# Patient Record
Sex: Male | Born: 1950 | Race: White | Hispanic: No | Marital: Married | Smoking: Former smoker
Health system: Southern US, Community
[De-identification: ages and names within clinical notes are randomized; demographics above are authoritative.]

## PROBLEM LIST (undated history)

## (undated) DIAGNOSIS — G459 Transient cerebral ischemic attack, unspecified: Secondary | ICD-10-CM

## (undated) DIAGNOSIS — I219 Acute myocardial infarction, unspecified: Secondary | ICD-10-CM

## (undated) DIAGNOSIS — G20 Parkinson's disease: Secondary | ICD-10-CM

## (undated) DIAGNOSIS — Z87442 Personal history of urinary calculi: Secondary | ICD-10-CM

## (undated) DIAGNOSIS — I209 Angina pectoris, unspecified: Secondary | ICD-10-CM

## (undated) DIAGNOSIS — K219 Gastro-esophageal reflux disease without esophagitis: Secondary | ICD-10-CM

## (undated) DIAGNOSIS — F0390 Unspecified dementia without behavioral disturbance: Secondary | ICD-10-CM

## (undated) HISTORY — PX: COLONOSCOPY WITH ESOPHAGOGASTRODUODENOSCOPY (EGD): SHX5779

## (undated) HISTORY — PX: CHOLECYSTECTOMY: SHX55

## (undated) HISTORY — PX: OTHER SURGICAL HISTORY: SHX169

---

## 2005-04-28 ENCOUNTER — Ambulatory Visit: Payer: Self-pay | Admitting: Family Medicine

## 2005-05-09 ENCOUNTER — Other Ambulatory Visit: Payer: Self-pay

## 2005-05-11 ENCOUNTER — Ambulatory Visit: Payer: Self-pay | Admitting: Surgery

## 2005-05-22 ENCOUNTER — Ambulatory Visit: Payer: Self-pay | Admitting: Surgery

## 2008-04-07 ENCOUNTER — Ambulatory Visit: Payer: Self-pay | Admitting: Family Medicine

## 2008-05-01 ENCOUNTER — Ambulatory Visit: Payer: Self-pay | Admitting: Neurology

## 2008-05-27 ENCOUNTER — Ambulatory Visit: Payer: Self-pay | Admitting: Otolaryngology

## 2008-05-28 ENCOUNTER — Ambulatory Visit: Payer: Self-pay | Admitting: Neurology

## 2009-02-12 ENCOUNTER — Ambulatory Visit: Payer: Self-pay | Admitting: Neurology

## 2009-08-26 ENCOUNTER — Ambulatory Visit: Payer: Self-pay | Admitting: Neurology

## 2009-09-27 IMAGING — CT CT ANGIO HEAD-NECK
2 of 5 series · 10 of 33 positions shown · IV contrast (APPLIED)
Comparison: none

REASON FOR EXAM: acoustic neuroma   to include brain and brain stem
COMMENTS:

PROCEDURE:     CT  - CT ANGIOGRAPHY HEAD NECK W/WO  - May 28, 2008  [DATE]
RESULT:     Comparisons: No comparison
INDICATION: Acoustic neurinoma
TECHNIQUE: 85 ml of Isovue 370 were administered and the extracranial
carotid arteries bilaterally were scanned during the arterial phase from the
level of the superior portion of the frontal sinuses to the proximal neck.
These images were then transferred to the Siemens work station and were
subsequently reviewed utilizing 3-D reconstructions and MIP images.

[Series 4: soft tissue · axial · 0.51mm/px · z∈[-276,-66]mm · 5 of 106 slices shown]
[im 18/106  soft-tissue]
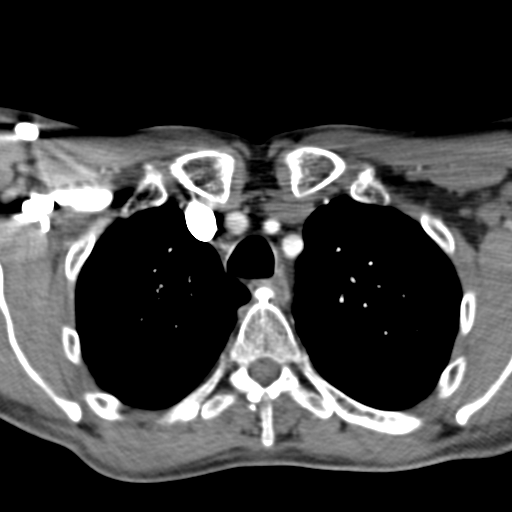
[im 36/106  bone]
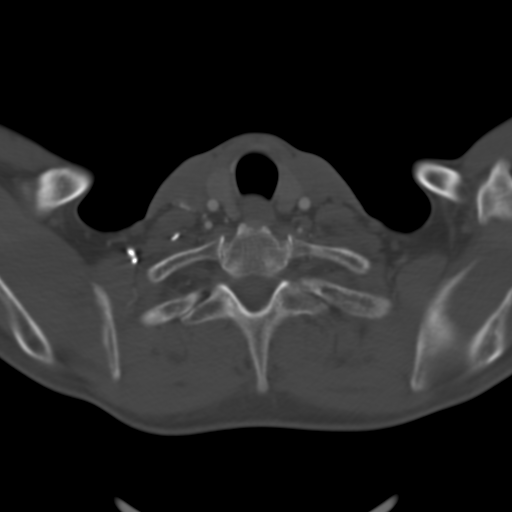
[im 53/106  soft-tissue]
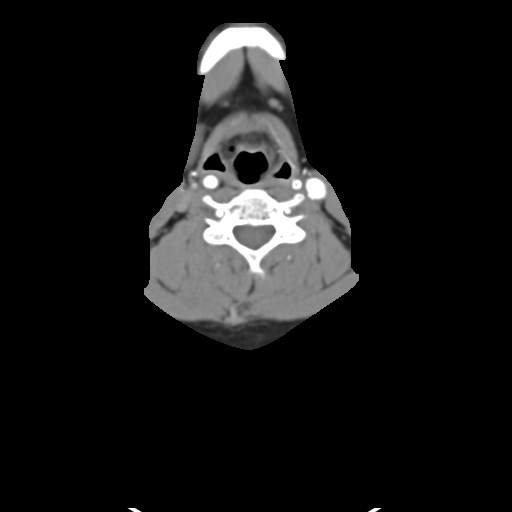
[im 71/106  bone]
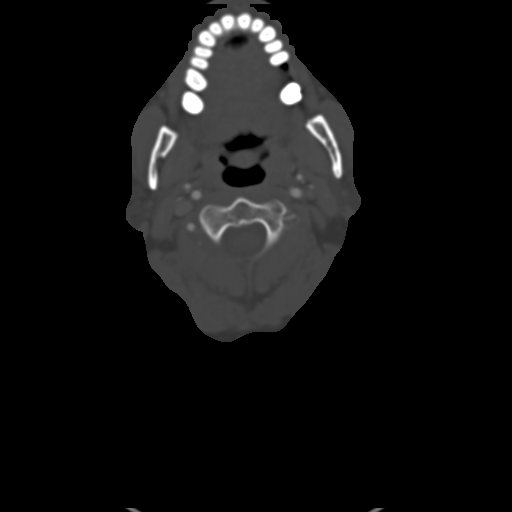
[im 88/106  soft-tissue]
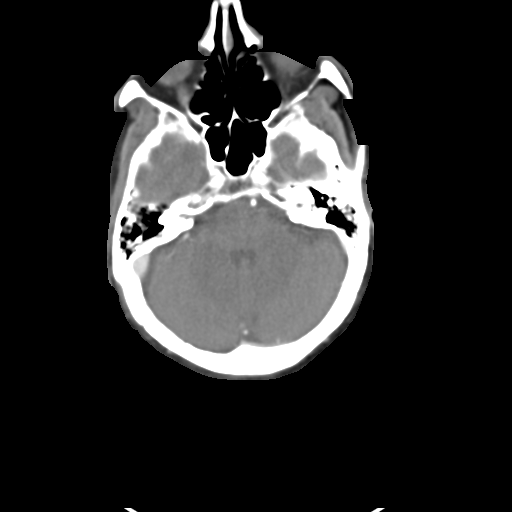

[Series 608: axial · axial · 0.38mm/px · z∈[-104,-36]mm · 5 of 103 slices shown]
[im 18/103  soft-tissue]
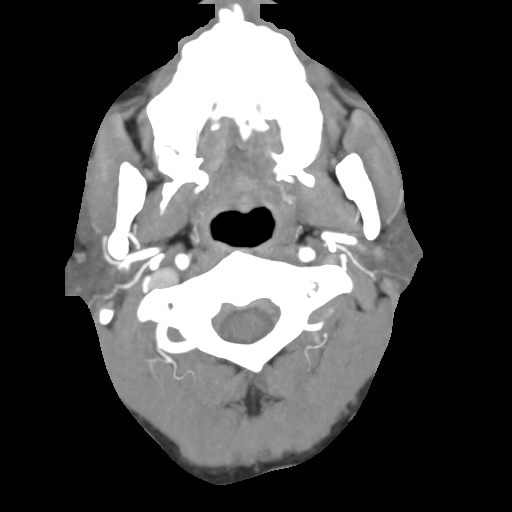
[im 35/103  soft-tissue]
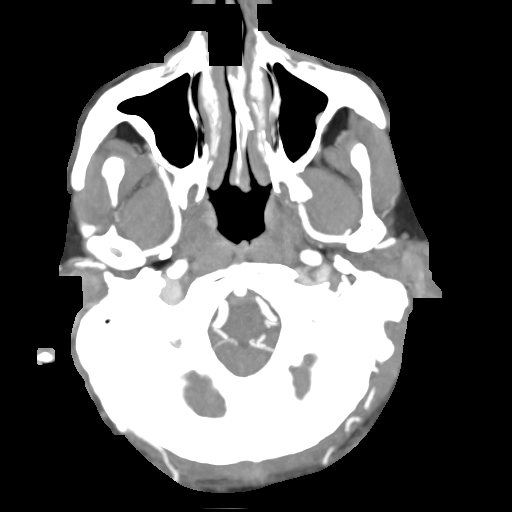
[im 52/103  soft-tissue]
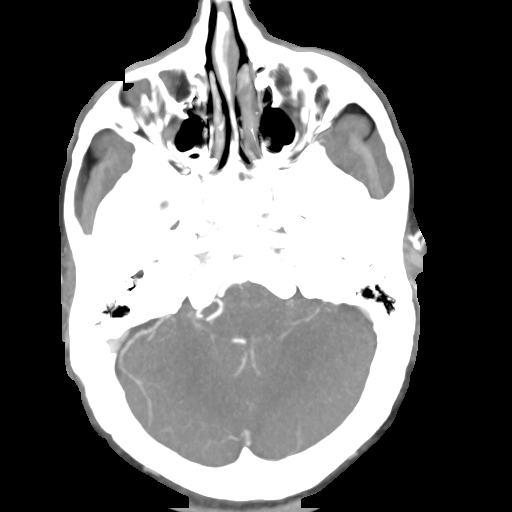
[im 69/103  soft-tissue]
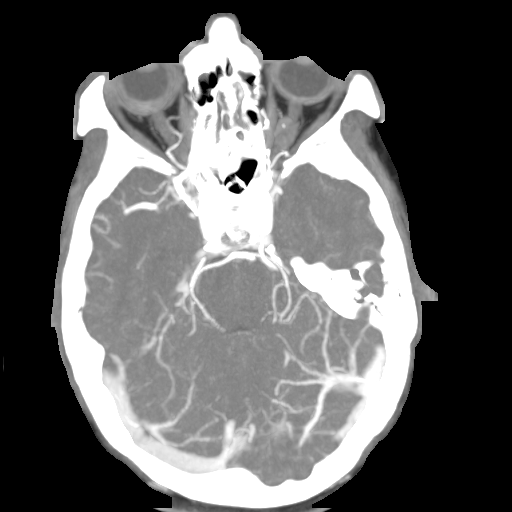
[im 86/103  soft-tissue]
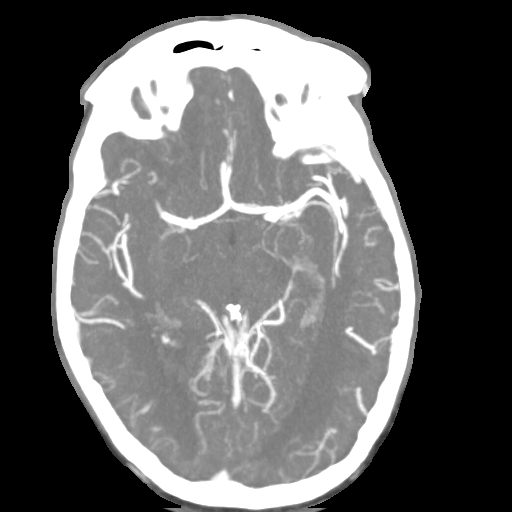

[10 of 33 positions shown; findings below may reference images not displayed]

FINDINGS: Extracranial circulation:

The left common carotid artery arises from the right brachiocephalic artery.
The vertebral arteries are identified arising from the subclavian arteries
and entering the transverse foramen bilaterally at C6. There is right
vertebral artery dominance. The distal aspect of the left vertebral artery
is small in caliber, but is patent.

The carotid arteries are identified and are symmetric and unremarkable. The
carotid bifurcations are also identified bilaterally and are unremarkable.
There is no evidence of aneurysm or carotid dissection bilaterally.

Intracranial circulation/circle-of-Willis:

Anterior: The internal carotid is identified and is patent bilaterally from
the skull base to the bifurcation into the middle cerebral and anterior
cerebral arteries. The anterior cerebral, anterior communicating, and
posterior communicating arteries are normal.

Posterior: The vertebral arteries are codominant and normal bilaterally from
the base of the skull to their confluence at the basilar artery. The
posterior cerebral arteries, superior cerebellar arteries, AICAs, and PICAs
are demonstrated and are unremarkable.

The left intracanalicular mass and surrounding recent MRI is not appreciated
on the current CT scan.
IMPRESSION: 1. No evidence of carotid artery stenosis or aneurysm.
2. Normal CTA of the brain.
3. The left intracanalicular mass demonstrated on the recent MRI is not
appreciated on the current CT scan. Please refer to MRI dated 05/27/2008
regarding the left acoustic neuroma.

## 2009-11-26 ENCOUNTER — Ambulatory Visit: Payer: Self-pay | Admitting: Neurology

## 2010-09-09 ENCOUNTER — Observation Stay: Payer: Self-pay | Admitting: Internal Medicine

## 2010-09-20 ENCOUNTER — Ambulatory Visit: Payer: Self-pay | Admitting: Gastroenterology

## 2010-09-21 ENCOUNTER — Ambulatory Visit: Payer: Self-pay | Admitting: Gastroenterology

## 2010-09-21 LAB — PATHOLOGY REPORT

## 2015-08-04 ENCOUNTER — Encounter: Payer: Self-pay | Admitting: Psychiatry

## 2015-08-04 ENCOUNTER — Emergency Department
Admission: EM | Admit: 2015-08-04 | Discharge: 2015-08-04 | Disposition: A | Discharge status: Other Acute Inpatient Hospital | Payer: Medicare Other | Attending: Emergency Medicine | Admitting: Emergency Medicine

## 2015-08-04 ENCOUNTER — Encounter: Payer: Self-pay | Admitting: Emergency Medicine

## 2015-08-04 ENCOUNTER — Inpatient Hospital Stay
Admission: EM | Admit: 2015-08-04 | Discharge: 2015-08-06 | DRG: 885 | Disposition: A | Discharge status: Home / Self Care | Payer: 59 | Source: Intra-hospital | Attending: Psychiatry | Admitting: Psychiatry

## 2015-08-04 DIAGNOSIS — R45851 Suicidal ideations: Secondary | ICD-10-CM | POA: Diagnosis present

## 2015-08-04 DIAGNOSIS — F329 Major depressive disorder, single episode, unspecified: Secondary | ICD-10-CM | POA: Insufficient documentation

## 2015-08-04 DIAGNOSIS — E538 Deficiency of other specified B group vitamins: Secondary | ICD-10-CM | POA: Diagnosis present

## 2015-08-04 DIAGNOSIS — G3183 Dementia with Lewy bodies: Secondary | ICD-10-CM | POA: Diagnosis present

## 2015-08-04 DIAGNOSIS — Z79899 Other long term (current) drug therapy: Secondary | ICD-10-CM

## 2015-08-04 DIAGNOSIS — I2510 Atherosclerotic heart disease of native coronary artery without angina pectoris: Secondary | ICD-10-CM | POA: Diagnosis present

## 2015-08-04 DIAGNOSIS — Z8673 Personal history of transient ischemic attack (TIA), and cerebral infarction without residual deficits: Secondary | ICD-10-CM

## 2015-08-04 DIAGNOSIS — F0280 Dementia in other diseases classified elsewhere without behavioral disturbance: Secondary | ICD-10-CM | POA: Diagnosis present

## 2015-08-04 DIAGNOSIS — Z9049 Acquired absence of other specified parts of digestive tract: Secondary | ICD-10-CM

## 2015-08-04 DIAGNOSIS — F332 Major depressive disorder, recurrent severe without psychotic features: Secondary | ICD-10-CM | POA: Diagnosis present

## 2015-08-04 DIAGNOSIS — Z87891 Personal history of nicotine dependence: Secondary | ICD-10-CM | POA: Diagnosis not present

## 2015-08-04 DIAGNOSIS — Z7902 Long term (current) use of antithrombotics/antiplatelets: Secondary | ICD-10-CM | POA: Diagnosis not present

## 2015-08-04 DIAGNOSIS — G20 Parkinson's disease: Secondary | ICD-10-CM | POA: Diagnosis present

## 2015-08-04 HISTORY — DX: Parkinson's disease: G20

## 2015-08-04 HISTORY — DX: Unspecified dementia, unspecified severity, without behavioral disturbance, psychotic disturbance, mood disturbance, and anxiety: F03.90

## 2015-08-04 HISTORY — DX: Transient cerebral ischemic attack, unspecified: G45.9

## 2015-08-04 LAB — URINALYSIS COMPLETE WITH MICROSCOPIC (ARMC ONLY)
Bacteria, UA: NONE SEEN
Bilirubin Urine: NEGATIVE
Glucose, UA: NEGATIVE mg/dL
HGB URINE DIPSTICK: NEGATIVE
KETONES UR: NEGATIVE mg/dL
LEUKOCYTES UA: NEGATIVE
NITRITE: NEGATIVE
PH: 7 (ref 5.0–8.0)
PROTEIN: NEGATIVE mg/dL
SPECIFIC GRAVITY, URINE: 1.004 — AB (ref 1.005–1.030)
Squamous Epithelial / LPF: NONE SEEN

## 2015-08-04 LAB — COMPREHENSIVE METABOLIC PANEL
ALBUMIN: 4.9 g/dL (ref 3.5–5.0)
ALT: 14 U/L — AB (ref 17–63)
ANION GAP: 11 (ref 5–15)
AST: 20 U/L (ref 15–41)
Alkaline Phosphatase: 64 U/L (ref 38–126)
BUN: 14 mg/dL (ref 6–20)
CHLORIDE: 101 mmol/L (ref 101–111)
CO2: 26 mmol/L (ref 22–32)
Calcium: 9.4 mg/dL (ref 8.9–10.3)
Creatinine, Ser: 0.91 mg/dL (ref 0.61–1.24)
GFR calc non Af Amer: >60 mL/min (ref >60)
GLUCOSE: 102 mg/dL — AB (ref 65–99)
Potassium: 4.1 mmol/L (ref 3.5–5.1)
SODIUM: 138 mmol/L (ref 135–145)
Total Bilirubin: 1.4 mg/dL — ABNORMAL HIGH (ref 0.3–1.2)
Total Protein: 7.8 g/dL (ref 6.5–8.1)

## 2015-08-04 LAB — URINE DRUG SCREEN, QUALITATIVE (ARMC ONLY)
AMPHETAMINES, UR SCREEN: NOT DETECTED
BENZODIAZEPINE, UR SCRN: NOT DETECTED
Barbiturates, Ur Screen: NOT DETECTED
Cannabinoid 50 Ng, Ur ~~LOC~~: NOT DETECTED
Cocaine Metabolite,Ur ~~LOC~~: NOT DETECTED
MDMA (Ecstasy)Ur Screen: NOT DETECTED
METHADONE SCREEN, URINE: NOT DETECTED
Opiate, Ur Screen: NOT DETECTED
Phencyclidine (PCP) Ur S: NOT DETECTED
Tricyclic, Ur Screen: NOT DETECTED

## 2015-08-04 LAB — CBC WITH DIFFERENTIAL/PLATELET
BASOS PCT: 1 %
Basophils Absolute: 0 10*3/uL (ref 0–0.1)
EOS ABS: 0.1 10*3/uL (ref 0–0.7)
EOS PCT: 1 %
HCT: 46.4 % (ref 40.0–52.0)
Hemoglobin: 15.2 g/dL (ref 13.0–18.0)
LYMPHS ABS: 1.2 10*3/uL (ref 1.0–3.6)
Lymphocytes Relative: 23 %
MCH: 29.6 pg (ref 26.0–34.0)
MCHC: 32.8 g/dL (ref 32.0–36.0)
MCV: 90.2 fL (ref 80.0–100.0)
Monocytes Absolute: 0.4 10*3/uL (ref 0.2–1.0)
Monocytes Relative: 8 %
NEUTROS PCT: 67 %
Neutro Abs: 3.4 10*3/uL (ref 1.4–6.5)
PLATELETS: 234 10*3/uL (ref 150–440)
RBC: 5.14 MIL/uL (ref 4.40–5.90)
RDW: 13.9 % (ref 11.5–14.5)
WBC: 5.1 10*3/uL (ref 3.8–10.6)

## 2015-08-04 LAB — SALICYLATE LEVEL

## 2015-08-04 LAB — ACETAMINOPHEN LEVEL: Acetaminophen (Tylenol), Serum: <10 ug/mL — ABNORMAL LOW (ref 10–30)

## 2015-08-04 LAB — ETHANOL

## 2015-08-04 MED ORDER — CLOPIDOGREL BISULFATE 75 MG PO TABS
75.0000 mg | ORAL_TABLET | Freq: Every day | ORAL | Status: DC
  Administered 2015-08-05 – 2015-08-06 (×2): 75 mg via ORAL
  Filled 2015-08-04 (×2): qty 1

## 2015-08-04 MED ORDER — MAGNESIUM HYDROXIDE 400 MG/5ML PO SUSP: 30.0000 mL | Freq: Every day | ORAL | Status: DC | PRN

## 2015-08-04 MED ORDER — MEMANTINE HCL 10 MG PO TABS
10.0000 mg | ORAL_TABLET | Freq: Two times a day (BID) | ORAL | Status: DC
  Administered 2015-08-04 – 2015-08-06 (×4): 10 mg via ORAL
  Filled 2015-08-04 (×6): qty 1

## 2015-08-04 MED ORDER — DONEPEZIL HCL 5 MG PO TABS: 10.0000 mg | ORAL_TABLET | Freq: Every day | ORAL | Status: DC

## 2015-08-04 MED ORDER — ESCITALOPRAM OXALATE 10 MG PO TABS: 20.0000 mg | ORAL_TABLET | Freq: Every day | ORAL | Status: DC

## 2015-08-04 MED ORDER — CYANOCOBALAMIN 1000 MCG/ML IJ SOLN: 1000.0000 ug | Freq: Once | INTRAMUSCULAR | Status: DC

## 2015-08-04 MED ORDER — DONEPEZIL HCL 5 MG PO TABS
10.0000 mg | ORAL_TABLET | Freq: Every day | ORAL | Status: DC
  Administered 2015-08-04 – 2015-08-05 (×2): 10 mg via ORAL
  Filled 2015-08-04 (×3): qty 1

## 2015-08-04 MED ORDER — CLONAZEPAM 0.5 MG PO TABS: 1.0000 mg | ORAL_TABLET | Freq: Every day | ORAL | Status: DC

## 2015-08-04 MED ORDER — CYANOCOBALAMIN 1000 MCG/ML IJ SOLN
1000.0000 ug | Freq: Once | INTRAMUSCULAR | Status: AC
  Administered 2015-08-04: 1000 ug via INTRAMUSCULAR
  Filled 2015-08-04: qty 1

## 2015-08-04 MED ORDER — QUETIAPINE FUMARATE 25 MG PO TABS: 25.0000 mg | ORAL_TABLET | Freq: Every day | ORAL | Status: DC

## 2015-08-04 MED ORDER — CLOPIDOGREL BISULFATE 75 MG PO TABS: 75.0000 mg | ORAL_TABLET | Freq: Every day | ORAL | Status: DC

## 2015-08-04 MED ORDER — MEMANTINE HCL 10 MG PO TABS: 10.0000 mg | ORAL_TABLET | Freq: Two times a day (BID) | ORAL | Status: DC

## 2015-08-04 MED ORDER — ACETAMINOPHEN 325 MG PO TABS
650.0000 mg | ORAL_TABLET | Freq: Four times a day (QID) | ORAL | Status: DC | PRN
Start: 2015-08-04 — End: 2015-08-06

## 2015-08-04 MED ORDER — CARBIDOPA-LEVODOPA ER 50-200 MG PO TBCR
1.0000 | EXTENDED_RELEASE_TABLET | Freq: Two times a day (BID) | ORAL | Status: DC
  Administered 2015-08-04 – 2015-08-06 (×4): 1 via ORAL
  Filled 2015-08-04 (×6): qty 1

## 2015-08-04 MED ORDER — ESCITALOPRAM OXALATE 10 MG PO TABS
20.0000 mg | ORAL_TABLET | Freq: Every day | ORAL | Status: DC
  Administered 2015-08-05 – 2015-08-06 (×2): 20 mg via ORAL
  Filled 2015-08-04 (×2): qty 2

## 2015-08-04 MED ORDER — QUETIAPINE FUMARATE 25 MG PO TABS
25.0000 mg | ORAL_TABLET | Freq: Every day | ORAL | Status: DC
  Administered 2015-08-05: 25 mg via ORAL
  Filled 2015-08-04: qty 1

## 2015-08-04 MED ORDER — CLONAZEPAM 1 MG PO TABS
1.0000 mg | ORAL_TABLET | Freq: Every day | ORAL | Status: DC
  Administered 2015-08-04 – 2015-08-05 (×2): 1 mg via ORAL
  Filled 2015-08-04 (×2): qty 1

## 2015-08-04 MED ORDER — CYANOCOBALAMIN 500 MCG PO TABS
1000.0000 ug | ORAL_TABLET | Freq: Every day | ORAL | Status: DC
  Administered 2015-08-05 – 2015-08-06 (×2): 1000 ug via ORAL
  Filled 2015-08-04 (×2): qty 2

## 2015-08-04 MED ORDER — ALUM & MAG HYDROXIDE-SIMETH 200-200-20 MG/5ML PO SUSP: 30.0000 mL | ORAL | Status: DC | PRN

## 2015-08-04 MED ORDER — CARBIDOPA-LEVODOPA ER 50-200 MG PO TBCR: 1.0000 | EXTENDED_RELEASE_TABLET | Freq: Two times a day (BID) | ORAL | Status: DC

## 2015-08-04 NOTE — ED Provider Notes (Signed)
[facility] Emergency Department Provider Note  ____________________________________________  Time seen: Approximately 12:10 PM  I have reviewed the triage vital signs and the nursing notes.   HISTORY  Chief Complaint Suicidal    HPI Darren Wang is a 65 y.o. male with a history of Parkinson's disease and TIA who is presenting today with worsening depression and suicidal ideation. He says that over the past several years his physical condition is worse and secondary to his neurologic disorders. He said that recently he has thought about killing himself with a gun. He does have access to guns at home. He has not attempted any self-harm prior to arrival. Denies any ingestions. No history of attempts ever in the past.   Past Medical History  Diagnosis Date  . Parkinson disease ([place])   . TIA (transient ischemic attack)   . Dementia     There are no active problems to display for this patient.   Past Surgical History  Procedure Laterality Date  . Cholecystectomy    . Leaky valve repair in bilateral le      No current outpatient prescriptions on file.  Allergies Review of patient's allergies indicates no known allergies.  No family history on file.  Social History Social History  Substance Use Topics  . Smoking status: Former Research scientist (life sciences)  . Smokeless tobacco: Not on file  . Alcohol Use: No    Review of Systems Constitutional: No fever/chills Eyes: No visual changes. ENT: No sore throat. Cardiovascular: Denies chest pain. Respiratory: Denies shortness of breath. Gastrointestinal: No abdominal pain.  No nausea, no vomiting.  No diarrhea.  No constipation. Genitourinary: Negative for dysuria. Musculoskeletal: Negative for back pain. Skin: Negative for rash. Neurological: Negative for headaches, focal weakness or numbness.  10-point ROS otherwise negative.  ____________________________________________   PHYSICAL EXAM:  VITAL SIGNS: ED  Triage Vitals  Enc Vitals Group     BP 08/04/15 1058 137/91 mmHg     Pulse Rate 08/04/15 1058 57     Resp 08/04/15 1058 18     Temp 08/04/15 1058 97.5 F (36.4 C)     Temp Source 08/04/15 1058 Oral     SpO2 08/04/15 1058 97 %     Weight 08/04/15 1058 165 lb (74.844 kg)     Height 08/04/15 1058 5\' 8"  (1.727 m)     Head Cir --      Peak Flow --      Pain Score 08/04/15 1111 6     Pain Loc --      Pain Edu? --      Excl. in [place]? --     Constitutional: Alert and oriented.  in no acute distress. Eyes: Conjunctivae are normal. PERRL. EOMI. Head: Atraumatic. Nose: No congestion/rhinnorhea. Mouth/Throat: Mucous membranes are moist.  Oropharynx non-erythematous. Neck: No stridor.   Cardiovascular: Normal rate, regular rhythm. Grossly normal heart sounds.  Good peripheral circulation. Respiratory: Normal respiratory effort.  No retractions. Lungs CTAB. Gastrointestinal: Soft and nontender. No distention. No abdominal bruits. No CVA tenderness. Musculoskeletal: No lower extremity tenderness nor edema.  No joint effusions. Neurologic:  Normal speech and language. No gross focal neurologic deficits are appreciated. No gait instability. Skin:  Skin is warm, dry and intact. No rash noted. Psychiatric: Depressed affect. Speech and behavior are normal.  ____________________________________________   LABS (all labs ordered are listed, but only abnormal results are displayed)  Labs Reviewed  CBC WITH DIFFERENTIAL/PLATELET  COMPREHENSIVE METABOLIC PANEL  ACETAMINOPHEN LEVEL  SALICYLATE LEVEL  ETHANOL  URINE DRUG SCREEN, QUALITATIVE ([facility] ONLY)  URINALYSIS COMPLETEWITH MICROSCOPIC ([facility] ONLY)   ____________________________________________  EKG   ____________________________________________  RADIOLOGY   ____________________________________________   PROCEDURES  ___________________________________________   INITIAL IMPRESSION / ASSESSMENT AND PLAN / ED COURSE  Pertinent  labs & imaging results that were available during my care of the patient were reviewed by me and considered in my medical decision making (see chart for details).  Family aware as well as the patient that he will be placed under involuntary commitment. ____________________________________________   FINAL CLINICAL IMPRESSION(S) / ED DIAGNOSES  Suicidal ideation.    Orbie Pyo, MD 08/04/15 [phone]

## 2015-08-04 NOTE — BH Assessment (Signed)
Assessment Note  Darren Wang is an 65 y.o. male Who presents to the ER via his wife Thayer Headings [phone]), after being advised to do so, by his neurologist. Patient was seen today by his neurologist, for his "3 month checkup." While in their office, he reported suicidal thoughts, with the plan of shooting his self. Patient owns a "shot gun" and his wife verified it.  Patient states, he's had these thoughts for the last 24 hours and they have increased over that length of time. He states, "I'm tired. I'm tired of living like this."  Patient was diagnosed with Parkinson disease, approximately 5 to 6 years ago. Since then his health has declined and he's been unable to do things, as he wishes. Per the report of his wife, the patient, spends most of his time, "home bound." Patient states, he is upset because he can't go for "walks and exercise," like he used to and things are not improving for him. He currently walks with a cane and falls approximately 3 times a week.   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 They were told by his doctors; he will eventually be completely blind. His quality of sleep has decrease over time, as well. Per the patient, for the last two weeks, he wakes up tired and feels as though he hasn't slept.  Patient denies HI and AV/H. He continues to report SI with plan to use his gun. Prior to today, patient denies any past psych history. Prior to being sick, he hasn't dealt with depression. He has no past suicidal ideations, gestures and attempts.  He's never seen a psychiatrist nor has he seen a therapist. Per the report of his wife, his neurologist prescribes his psychotropic medications and they believe they are no longer working for him.  Diagnosis: Depression  Past Medical History:  Past Medical History  Diagnosis Date  . Parkinson disease ([place])   . TIA (transient ischemic attack)   . Dementia     Past Surgical History   Procedure Laterality Date  . Cholecystectomy    . Leaky valve repair in bilateral le      Family History: No family history on file.  Social History:  reports that he has quit smoking. He does not have any smokeless tobacco history on file. He reports that he does not drink alcohol. His drug history is not on file.  Additional Social History:     CIWA: CIWA-Ar BP: (!) 137/91 mmHg Pulse Rate: (!) 57 COWS:    Allergies: Allergies no known allergies  Home Medications:  (Not in a hospital admission)  OB/GYN Status:  No LMP for male patient.  General Assessment Data Location of Assessment: [facility] ED TTS Assessment: In system Is this a Tele or Face-to-Face Assessment?: Face-to-Face Is this an Initial Assessment or a Re-assessment for this encounter?: Initial Assessment Marital status: Married [place] name: n/a Is patient pregnant?: No Pregnancy Status: No Living Arrangements: Spouse/significant other, Children Can pt return to current living arrangement?: Yes Admission Status: Involuntary Is patient capable of signing voluntary admission?: No Referral Source: MD Insurance type: [place] Screening Exam ([place]) Medical Exam completed: Yes  Crisis Care Plan Living Arrangements: Spouse/significant other, Children Legal Guardian: Other: (None) Name of Psychiatrist: None Name of Therapist: None  Education Status Is patient currently in school?: No Current Grade: None Highest grade of school patient has completed: [employer] Name of school: n/a Contact person: n/a  Risk to self with the past 6 months Suicidal Ideation: Yes-Currently Present Has patient been a risk to self within the past 6 months prior to admission? : Yes Suicidal Intent: Yes-Currently Present Has patient had any suicidal intent within the past 6 months prior to admission? : Yes Is patient at risk for suicide?: Yes Suicidal Plan?: Yes-Currently Present Has patient had any  suicidal plan within the past 6 months prior to admission? : Yes Specify Current Suicidal Plan: Shoot himself Access to Means: Yes Specify Access to Suicidal Means: Own a gun What has been your use of drugs/alcohol within the last 12 months?: None Reported Previous Attempts/Gestures: No How many times?: 0 Other Self Harm Risks: None Reported Triggers for Past Attempts: None known Intentional Self Injurious Behavior: None Family Suicide History: No Recent stressful life event(s): Recent negative physical changes Persecutory voices/beliefs?: No Depression: Yes Depression Symptoms: Feeling angry/irritable, Feeling worthless/self pity, Loss of interest in usual pleasures, Guilt, Fatigue, Isolating, Tearfulness Substance abuse history and/or treatment for substance abuse?: No Suicide prevention information given to non-admitted patients: Not applicable  Risk to Others within the past 6 months Homicidal Ideation: No Does patient have any lifetime risk of violence toward others beyond the six months prior to admission? : No Thoughts of Harm to Others: No Current Homicidal Intent: No Current Homicidal Plan: No Access to Homicidal Means: No Identified Victim: None Reported History of harm to others?: No Assessment of Violence: None Noted Violent Behavior Description: None Reported Does patient have access to weapons?: No Criminal Charges Pending?: No Does patient have a court date: No Is patient on probation?: No  Psychosis Hallucinations: None noted Delusions: None noted  Mental Status Report Appearance/Hygiene: Unremarkable, In scrubs, In hospital gown Eye Contact: Good Motor Activity: Unable to assess (Patient laying in the bed) Speech: Logical/coherent, Unremarkable Level of Consciousness: Alert Mood: Depressed, Sad, Pleasant Affect: Appropriate to circumstance, Depressed Anxiety Level: Minimal Thought Processes: Coherent, Relevant Judgement: Partial Orientation: Person,  Place, Time, Situation, Appropriate for developmental age Obsessive Compulsive Thoughts/Behaviors: Minimal  Cognitive Functioning Concentration: Normal Memory: Recent Intact, Remote Intact IQ: Average Insight: Poor Impulse Control: Poor Appetite: Fair Weight Loss: 0 Weight Gain: 0 Sleep: No Change (He is sleeping but not getting any rest) Total Hours of Sleep: 8 Vegetative Symptoms: None  ADLScreening [facility] Assessment Services) Patient's cognitive ability adequate to safely complete daily activities?: Yes Patient able to express need for assistance with ADLs?: Yes Independently performs ADLs?: Yes (appropriate for developmental age)  Prior Inpatient Therapy Prior Inpatient Therapy: No Prior Therapy Dates: None Reported Prior Therapy Facilty/Provider(s): None Reported Reason for Treatment: None Reported  Prior Outpatient Therapy Prior Outpatient Therapy: No Prior Therapy Dates: None Reported Prior Therapy Facilty/Provider(s): None Reported Reason for Treatment: None Reported Does patient have an ACCT team?: No Does patient have Intensive In-House Services?  : No Does patient have [facility] services? : No Does patient have P4CC services?: No  ADL Screening (condition at time of admission) Patient's cognitive ability adequate to safely complete daily activities?: Yes Is the patient deaf or have difficulty hearing?: No Does the patient have difficulty seeing, even when wearing glasses/contacts?: Yes (Trouble seeing out of left eye) Does the patient have difficulty concentrating, remembering, or making decisions?: No Patient able to express need for assistance with ADLs?: Yes Does the patient have difficulty dressing or bathing?: No Independently performs ADLs?: Yes (appropriate for developmental age) Does the patient have difficulty walking or climbing stairs?: No Weakness of Legs: Both Weakness of Arms/Hands:  Both  Home Assistive Devices/Equipment Home Assistive  Devices/Equipment: None  Therapy Consults (therapy consults require a physician order) PT Evaluation Needed: No OT Evalulation Needed: No SLP Evaluation Needed: No Abuse/Neglect Assessment (Assessment to be complete while patient is alone) Physical Abuse: Denies Verbal Abuse: Denies Sexual Abuse: Denies Exploitation of patient/patient's resources: Denies Self-Neglect: Denies Values / Beliefs Cultural Requests During Hospitalization: None Spiritual Requests During Hospitalization: None Consults Spiritual Care Consult Needed: No Social Work Consult Needed: No      Additional Information 1:1 In Past 12 Months?: No CIRT Risk: No Elopement Risk: No Does patient have medical clearance?: Yes  Child/Adolescent Assessment Running Away Risk: Denies (Patient is an adult  )  Disposition:  Disposition Initial Assessment Completed for this Encounter: Yes Disposition of Patient: Other dispositions (Patient to be seen by Psych MD) Other disposition(s): Other (Comment) (Patient to be seen by Psych MD)  On Site Evaluation by:   Reviewed with Physician:    Gunnar Fusi, MS, LCAS, [place], [place], CCSI 08/04/2015 2:11 PM

## 2015-08-04 NOTE — ED Notes (Signed)
Pt admits to feeling suicidal. Pt reports his plan would be to use a gun. Pt and pts wife reports that patient has access to  Guns. Pt's wife reports pt has been battling depression for a while.

## 2015-08-04 NOTE — Consult Note (Signed)
[facility] Face-to-Face Psychiatry Consult   Reason for Consult:  Consult for this 65 year old man who presents stating he is having suicidal ideation Referring Physician:  Edd Fabian Patient Identification: Darren Wang MRN:  [id number] Principal Diagnosis: Severe depression Diagnosis:   Patient Active Problem List   Diagnosis Date Noted  . Parkinson disease ([place]) [G20] 08/04/2015  . Severe depression [F32.9] 08/04/2015  . Suicidal ideation [R45.851] 08/04/2015    Total Time spent with patient: 1 hour  Subjective:   Darren Wang is a 65 y.o. male patient admitted with "I'm having suicidal thoughts".  HPI:  Patient interviewed. Chart reviewed. Labs and vitals reviewed. 65 year old man came to the emergency room stating he was having suicidal thoughts. He tells me that today and yesterday he was having thoughts about shooting himself with a shotgun. He told other interviewers the same thing. He says that his mood gets down and sad and lonely and it will come and go. It's been may be worse the last couple days. He is pretty vague in describing all this. He says that he gets lonely even though he lives with his wife and son. He feels sad at times. He denies any problems with sleep or appetite. He says he's been compliant with all of his medicines as far as he knows. The patient has Parkinson's disease and has also been told that he has Lewy body dementia and is being treated for both of these. He says he has talked about the depression with other providers as well. Patient denies homicidal ideation. He denies that he's had any visual or auditory hallucinations. He is not drinking or abusing any drugs.  Social history: Patient lives his wife and son. He is retired. Can't drive anymore. Mostly spends his time around the house.  Medical history: Parkinson's disease diagnosed wraps for about a year. Other than that seems to be in pretty good health no known history that he reports of heart disease or lung  disease. Says he is compliant with medicine. Sees a neurologist at [facility].  Substance abuse history: He says he doesn't drink or use any drugs and denies any past history of any substance abuse problems.  Past Psychiatric History: No prior psychiatric hospitalization. No history of suicide attempts. It appears that he is currently on Lexapro presumably prescribed by his neurologist. Patient is not aware of ever being on any other medicine. He does not report any manic or psychotic symptoms. He's not a very great historian and it's hard for them to give very detailed history about why his mood has been so bad.    Risk to Self: Suicidal Ideation: Yes-Currently Present Suicidal Intent: Yes-Currently Present Is patient at risk for suicide?: Yes Suicidal Plan?: Yes-Currently Present Specify Current Suicidal Plan: Shoot himself Access to Means: Yes Specify Access to Suicidal Means: Own a gun What has been your use of drugs/alcohol within the last 12 months?: None Reported How many times?: 0 Other Self Harm Risks: None Reported Triggers for Past Attempts: None known Intentional Self Injurious Behavior: None Risk to Others: Homicidal Ideation: No Thoughts of Harm to Others: No Current Homicidal Intent: No Current Homicidal Plan: No Access to Homicidal Means: No Identified Victim: None Reported History of harm to others?: No Assessment of Violence: None Noted Violent Behavior Description: None Reported Does patient have access to weapons?: No Criminal Charges Pending?: No Does patient have a court date: No Prior Inpatient Therapy: Prior Inpatient Therapy: No Prior Therapy Dates: None Reported Prior Therapy Facilty/Provider(s): None  Reported Reason for Treatment: None Reported Prior Outpatient Therapy: Prior Outpatient Therapy: No Prior Therapy Dates: None Reported Prior Therapy Facilty/Provider(s): None Reported Reason for Treatment: None Reported Does patient have an ACCT team?: No Does  patient have Intensive In-House Services?  : No Does patient have [facility] services? : No Does patient have P4CC services?: No  Past Medical History:  Past Medical History  Diagnosis Date  . Parkinson disease ([place])   . TIA (transient ischemic attack)   . Dementia     Past Surgical History  Procedure Laterality Date  . Cholecystectomy    . Leaky valve repair in bilateral le     Family History: No family history on file. Family Psychiatric  History: Patient denies knowing of any family history of mental health or substance abuse problems Social History:  History  Alcohol Use No     History  Drug Use Not on file    Social History   Social History  . Marital Status: Married    Spouse Name: N/A  . Number of Children: N/A  . Years of Education: N/A   Social History Main Topics  . Smoking status: Former Research scientist (life sciences)  . Smokeless tobacco: Not on file  . Alcohol Use: No  . Drug Use: Not on file  . Sexual Activity: Not on file   Other Topics Concern  . Not on file   Social History Narrative  . No narrative on file   Additional Social History:                          Allergies:  Allergies no known allergies  Labs:  Results for orders placed or performed during the hospital encounter of 08/04/15 (from the past 48 hour(s))  CBC with Differential     Status: None   Collection Time: 08/04/15 11:25 AM  Result Value Ref Range   WBC 5.1 3.8 - 10.6 K/uL   RBC 5.14 4.40 - 5.90 MIL/uL   Hemoglobin 15.2 13.0 - 18.0 g/dL   HCT 46.4 40.0 - 52.0 %   MCV 90.2 80.0 - 100.0 fL   MCH 29.6 26.0 - 34.0 pg   MCHC 32.8 32.0 - 36.0 g/dL   RDW 13.9 11.5 - 14.5 %   Platelets 234 150 - 440 K/uL   Neutrophils Relative % 67 %   Neutro Abs 3.4 1.4 - 6.5 K/uL   Lymphocytes Relative 23 %   Lymphs Abs 1.2 1.0 - 3.6 K/uL   Monocytes Relative 8 %   Monocytes Absolute 0.4 0.2 - 1.0 K/uL   Eosinophils Relative 1 %   Eosinophils Absolute 0.1 0 - 0.7 K/uL   Basophils Relative 1 %    Basophils Absolute 0.0 0 - 0.1 K/uL  Comprehensive metabolic panel     Status: Abnormal   Collection Time: 08/04/15 11:25 AM  Result Value Ref Range   Sodium 138 135 - 145 mmol/L   Potassium 4.1 3.5 - 5.1 mmol/L   Chloride 101 101 - 111 mmol/L   CO2 26 22 - 32 mmol/L   Glucose, Bld 102 (H) 65 - 99 mg/dL   BUN 14 6 - 20 mg/dL   Creatinine, Ser 0.91 0.61 - 1.24 mg/dL   Calcium 9.4 8.9 - 10.3 mg/dL   Total Protein 7.8 6.5 - 8.1 g/dL   Albumin 4.9 3.5 - 5.0 g/dL   AST 20 15 - 41 U/L   ALT 14 (L) 17 - 63 U/L  Alkaline Phosphatase 64 38 - 126 U/L   Total Bilirubin 1.4 (H) 0.3 - 1.2 mg/dL   GFR calc non Af Amer >60 >60 mL/min   GFR calc Af Amer >60 >60 mL/min    Comment: (NOTE) The eGFR has been calculated using the CKD EPI equation. This calculation has not been validated in all clinical situations. eGFR's persistently <60 mL/min signify possible Chronic Kidney Disease.    Anion gap 11 5 - 15  Acetaminophen level     Status: Abnormal   Collection Time: 08/04/15 11:25 AM  Result Value Ref Range   Acetaminophen (Tylenol), Serum <10 (L) 10 - 30 ug/mL    Comment:        THERAPEUTIC CONCENTRATIONS VARY SIGNIFICANTLY. A RANGE OF 10-30 ug/mL MAY BE AN EFFECTIVE CONCENTRATION FOR MANY PATIENTS. HOWEVER, SOME ARE BEST TREATED AT CONCENTRATIONS OUTSIDE THIS RANGE. ACETAMINOPHEN CONCENTRATIONS >150 ug/mL AT 4 HOURS AFTER INGESTION AND >50 ug/mL AT 12 HOURS AFTER INGESTION ARE OFTEN ASSOCIATED WITH TOXIC REACTIONS.   Salicylate level     Status: None   Collection Time: 08/04/15 11:25 AM  Result Value Ref Range   Salicylate Lvl <3.3 2.8 - 30.0 mg/dL  Ethanol     Status: None   Collection Time: 08/04/15 11:25 AM  Result Value Ref Range   Alcohol, Ethyl (B) <5 <5 mg/dL    Comment:        LOWEST DETECTABLE LIMIT FOR SERUM ALCOHOL IS 5 mg/dL FOR MEDICAL PURPOSES ONLY   Urine Drug Screen, Qualitative ([facility] only)     Status: None   Collection Time: 08/04/15  3:10 PM  Result Value  Ref Range   Tricyclic, Ur Screen NONE DETECTED NONE DETECTED   Amphetamines, Ur Screen NONE DETECTED NONE DETECTED   MDMA (Ecstasy)Ur Screen NONE DETECTED NONE DETECTED   Cocaine Metabolite,Ur [place] NONE DETECTED NONE DETECTED   Opiate, Ur Screen NONE DETECTED NONE DETECTED   Phencyclidine (PCP) Ur S NONE DETECTED NONE DETECTED   Cannabinoid 50 Ng, Ur [place] NONE DETECTED NONE DETECTED   Barbiturates, Ur Screen NONE DETECTED NONE DETECTED   Benzodiazepine, Ur Scrn NONE DETECTED NONE DETECTED   Methadone Scn, Ur NONE DETECTED NONE DETECTED    Comment: (NOTE) 354  Tricyclics, urine               Cutoff 1000 ng/mL 200  Amphetamines, urine             Cutoff 1000 ng/mL 300  MDMA (Ecstasy), urine           Cutoff 500 ng/mL 400  Cocaine Metabolite, urine       Cutoff 300 ng/mL 500  Opiate, urine                   Cutoff 300 ng/mL 600  Phencyclidine (PCP), urine      Cutoff 25 ng/mL 700  Cannabinoid, urine              Cutoff 50 ng/mL 800  Barbiturates, urine             Cutoff 200 ng/mL 900  Benzodiazepine, urine           Cutoff 200 ng/mL 1000 Methadone, urine                Cutoff 300 ng/mL 1100 1200 The urine drug screen provides only a preliminary, unconfirmed 1300 analytical test result and should not be used for non-medical 1400 purposes. Clinical consideration and professional judgment should 1500 be  applied to any positive drug screen result due to possible 1600 interfering substances. A more specific alternate chemical method 1700 must be used in order to obtain a confirmed analytical result.  1800 Gas chromato graphy / mass spectrometry (GC/MS) is the preferred 1900 confirmatory method.   Urinalysis complete, with microscopic ([facility] only)     Status: Abnormal   Collection Time: 08/04/15  3:10 PM  Result Value Ref Range   Color, Urine STRAW (A) YELLOW   APPearance CLEAR (A) CLEAR   Glucose, UA NEGATIVE NEGATIVE mg/dL   Bilirubin Urine NEGATIVE NEGATIVE   Ketones, ur NEGATIVE  NEGATIVE mg/dL   Specific Gravity, Urine 1.004 (L) 1.005 - 1.030   Hgb urine dipstick NEGATIVE NEGATIVE   pH 7.0 5.0 - 8.0   Protein, ur NEGATIVE NEGATIVE mg/dL   Nitrite NEGATIVE NEGATIVE   Leukocytes, UA NEGATIVE NEGATIVE   RBC / HPF 0-5 0 - 5 RBC/hpf   WBC, UA 0-5 0 - 5 WBC/hpf   Bacteria, UA NONE SEEN NONE SEEN   Squamous Epithelial / LPF NONE SEEN NONE SEEN    Current Facility-Administered Medications  Medication Dose Route Frequency Provider Last Rate Last Dose  . carbidopa-levodopa (SINEMET CR) 50-200 MG per tablet controlled release 1 tablet  1 tablet Oral BID Gonzella Lex, MD      . clonazePAM (KLONOPIN) tablet 1 mg  1 mg Oral QHS Biridiana Twardowski T Medora Roorda, MD      . clopidogrel (PLAVIX) tablet 75 mg  75 mg Oral Daily Tyniah Kastens T Zailen Albarran, MD      . cyanocobalamin ((VITAMIN B-12)) injection 1,000 mcg  1,000 mcg Intramuscular Once Gonzella Lex, MD      . donepezil (ARICEPT) tablet 10 mg  10 mg Oral QHS Philis Doke T Harlie Buening, MD      . escitalopram (LEXAPRO) tablet 20 mg  20 mg Oral Daily Gonzella Lex, MD      . memantine [facility]) tablet 10 mg  10 mg Oral BID Gonzella Lex, MD      . QUEtiapine (SEROQUEL) tablet 25 mg  25 mg Oral QPC supper Gonzella Lex, MD       Current Outpatient Prescriptions  Medication Sig Dispense Refill  . carbidopa-levodopa (SINEMET CR) 50-200 MG tablet Take 1 tablet by mouth 2 (two) times daily.    . clonazePAM (KLONOPIN) 1 MG tablet Take 1 mg by mouth at bedtime.    . clopidogrel (PLAVIX) 75 MG tablet Take 75 mg by mouth daily.    Marland Kitchen donepezil (ARICEPT) 10 MG tablet Take 10 mg by mouth daily before breakfast.    . escitalopram (LEXAPRO) 20 MG tablet Take 20 mg by mouth daily.    . memantine (NAMENDA) 10 MG tablet Take 10 mg by mouth 2 (two) times daily.    . Multiple Vitamin (MULTIVITAMIN) capsule Take 1 capsule by mouth daily.    . QUEtiapine (SEROQUEL) 25 MG tablet Take 25 mg by mouth daily. At 1700    . vitamin B-12 (CYANOCOBALAMIN) 1000 MCG tablet Take 1,000  mcg by mouth daily.      Musculoskeletal: Strength & Muscle Tone: within normal limits Gait & Station: unsteady Patient leans: N/A  Psychiatric Specialty Exam: Review of Systems  HENT: Negative.   Eyes: Negative.   Respiratory: Negative.   Cardiovascular: Negative.   Gastrointestinal: Negative.   Musculoskeletal: Negative.   Skin: Negative.   Neurological: Positive for tremors and weakness.  Psychiatric/Behavioral: Positive for depression, suicidal ideas and memory loss. Negative for  hallucinations. The patient is nervous/anxious. The patient does not have insomnia.     Blood pressure 137/91, pulse 57, temperature 97.5 F (36.4 C), temperature source Oral, resp. rate 18, height 5' 8"  (1.727 m), weight 74.844 kg (165 lb), SpO2 97 %.Body mass index is 25.09 kg/(m^2).  General Appearance: Disheveled  Eye Contact::  Good  Speech:  Slow  Volume:  Decreased  Mood:  Depressed and Dysphoric  Affect:  Depressed  Thought Process:  Circumstantial and Tangential  Orientation:  Full (Time, Place, and Person)  Thought Content:  Negative  Suicidal Thoughts:  Yes.  with intent/plan  Homicidal Thoughts:  No  Memory:  Immediate;   Good Recent;   Good Remote;   Fair  Judgement:  Fair  Insight:  Fair  Psychomotor Activity:  Decreased  Concentration:  Fair  Recall:  [employer] of Knowledge:Fair  Language: Fair  Akathisia:  No  Handed:  Right  AIMS (if indicated):     Assets:  Desire for Improvement Financial Resources/Insurance Housing Physical Health Resilience Social Support  ADL's:  Intact  Cognition: Impaired,  Mild  Sleep:      Treatment Plan Summary: Daily contact with patient to assess and evaluate symptoms and progress in treatment, Medication management and Plan 65 year old man who is reporting acute suicidal ideation. There does not seem to be a clear stressor that immediately precipitated this. He is reporting depressed mood symptoms that have been present probably  intermittently for months but it's not clear why he suddenly got this idea to shoot himself. Patient doesn't have a clear past mental health history although he does have Parkinson's disease. He tells me he's been told he had Lewy body dementia although to my examination his dementia is mild if present at all. He could remember 3 objects after 3 minutes and held a normal conversation. Completely oriented. Patient's affect is flat although part of that may be the Parkinson's disease. Given his statements about shooting himself and having suicidal thoughts I think it is imperative to admit him to the hospital. Patient is able to ambulate without assistance and should be safe on the unit. He will be admitted to the hospital and kept on his current medication including the Lexapro as well as the 2 medicines for dementia that he is taking. Treatment team downstairs can further assess. 15 minute checks in place. He is agreeable to the plan.  Disposition: Recommend psychiatric Inpatient admission when medically cleared. Supportive therapy provided about ongoing stressors.  Kevionna Heffler 08/04/2015 5:11 PM

## 2015-08-05 MED ORDER — BUSPIRONE HCL 5 MG PO TABS
5.0000 mg | ORAL_TABLET | Freq: Two times a day (BID) | ORAL | Status: DC
  Administered 2015-08-05 – 2015-08-06 (×3): 5 mg via ORAL
  Filled 2015-08-05 (×3): qty 1

## 2015-08-05 NOTE — Progress Notes (Signed)
Admission Note: Admitted for Depression and Suicidal thoughts with lethal intent to shoot self with a shot gun; patient has a shot gun. BIB and IVC by wife, Ms Hadi Francy [phone] after neurologist visit. Patient has Lewy Body Dementia and Parkinson disease, "I am tired. I am tired of living like this .... " Poor insight and judgment, lacks sufficient mental capacity to make rational decisions regarding financial and medical matters, recently a victim of online scams. No contrabands found on patient during initial admission search by 2 RNs, Gross Skin Intact except scaly, ashen left leg Shin, no open wounds, no tattoos or piercing.

## 2015-08-05 NOTE — BHH Group Notes (Signed)
[place] LCSW Group Therapy  08/05/2015 3:14 PM  Type of Therapy:  Group Therapy  Participation Level:  Active  Participation Quality:  Attentive  Affect:  Appropriate  Cognitive:  Alert  Insight:  Improving  Engagement in Therapy:  Improving  Modes of Intervention:  Discussion, Education, Socialization and Support  Summary of Progress/Problems: Balance in life: Patients will discuss the concept of balance and how it looks and feels to be unbalanced. Pt will identify areas in their life that is unbalanced and ways to become more balanced.  Darren Wang attended group and stayed the entire time. He discussed trust how it is important to him.   [place] MSW, [place]  08/05/2015, 3:14 PM

## 2015-08-05 NOTE — Progress Notes (Signed)
Recreation Therapy Notes  Date: 01.26.17 Time: 3:00 pm Location: Craft Room  Group Topic: Leisure Education  Goal Area(s) Addresses:  Patient will identify activities for each letter of the alphabet. Patient will verbalize ability to integrate positive leisure into life post d/c. 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  Behavioral Response: Attentive, Interactive  Intervention: Leisure Alphabet  Activity: Patients were given a Leisure Air traffic controller and instructed to list healthy leisure activities for each letter of the alphabet bet.  Education: LRT educated patients on what they need to participate in leisure.  Education Outcome: Acknowledges education/In group clarification offered   Clinical Observations/Feedback: Patient completed activity by writing healthy leisure activities. Patient contributed to group discussion by stating some of the leisure activities he wrote down.  Leonette [facility], LRT/CTRS 08/05/2015 4:22 PM

## 2015-08-05 NOTE — Tx Team (Signed)
Initial Interdisciplinary Treatment Plan   PATIENT STRESSORS: Financial difficulties Health problems   PATIENT STRENGTHS: Motivation for treatment/growth Supportive family/friends   PROBLEM LIST: Problem List/Patient Goals Date to be addressed Date deferred Reason deferred Estimated date of resolution  Depressive Symptoms 08/04/2015     Suicidal Thoughts 08/04/2015                                                DISCHARGE CRITERIA:  Improved stabilization in mood, thinking, and/or behavior Motivation to continue treatment in a less acute level of care Reduction of life-threatening or endangering symptoms to within safe limits  PRELIMINARY DISCHARGE PLAN: Attend aftercare/continuing care group Return to previous living arrangement  PATIENT/FAMIILY INVOLVEMENT: This treatment plan has been presented to and reviewed with the patient, NIV LUISI, and/or family member.  The patient and family have been given the opportunity to ask questions and make suggestions.  Alexiz Cothran T Montay Vanvoorhis 08/05/2015, 3:26 AM

## 2015-08-05 NOTE — Plan of Care (Signed)
Problem: Alteration in mood Goal: LTG-Patient reports reduction in suicidal thoughts (Patient reports reduction in suicidal thoughts and is able to verbalize a safety plan for whenever patient is feeling suicidal)  Outcome: Progressing Denies suicidal ideations.

## 2015-08-05 NOTE — BHH Group Notes (Signed)
[place] Group Notes:  (Nursing/MHT/Case Management/Adjunct)  Date:  08/05/2015  Time:  2:02 PM  Type of Therapy:  Psychoeducational Skills  Participation Level:  Active  Participation Quality:  Appropriate, Attentive, Sharing and Supportive  Affect:  Appropriate  Cognitive:  Appropriate  Insight:  Appropriate  Engagement in Group:  Supportive  Modes of Intervention:  Discussion and Education  Summary of Progress/Problems:  Celso Amy 08/05/2015, 2:02 PM

## 2015-08-05 NOTE — BHH Suicide Risk Assessment (Signed)
[facility] Admission Suicide Risk Assessment   Nursing information obtained from:    Demographic factors:    Current Mental Status:    Loss Factors:    Historical Factors:    Risk Reduction Factors:     Total Time spent with patient: 1 hour Principal Problem: Major depressive disorder, recurrent severe without psychotic features ([place]) Diagnosis:   Patient Active Problem List   Diagnosis Date Noted  . Parkinson disease ([place]) [G20] 08/04/2015  . Suicidal ideation [R45.851] 08/04/2015  . Major depressive disorder, recurrent severe without psychotic features ([place]) [F33.2] 08/04/2015  . Lewy body dementia [G31.83, F02.80] 08/04/2015   Subjective Data: Depression and suicidal ideation  Continued Clinical Symptoms:  Alcohol Use Disorder Identification Test Final Score (AUDIT): 0 The "Alcohol Use Disorders Identification Test", Guidelines for Use in Primary Care, Second Edition.  World Pharmacologist [facility]). Score between 0-7:  no or low risk or alcohol related problems. Score between 8-15:  moderate risk of alcohol related problems. Score between 16-19:  high risk of alcohol related problems. Score 20 or above:  warrants further diagnostic evaluation for alcohol dependence and treatment.   CLINICAL FACTORS:   Depression:   Hopelessness Severe   Musculoskeletal: Strength & Muscle Tone: within normal limits Gait & Station: normal Patient leans: N/A  Psychiatric Specialty Exam: Review of Systems  Neurological: Positive for tremors.  All other systems reviewed and are negative.   Blood pressure 111/73, pulse 49, temperature 97.7 F (36.5 C), temperature source Oral, resp. rate 18, height 5\' 8"  (1.727 m), weight 74.844 kg (165 lb), SpO2 99 %.Body mass index is 25.09 kg/(m^2).  General Appearance: Casual  Eye Contact::  Good  Speech:  Clear and Coherent  Volume:  Normal  Mood:  Depressed  Affect:  Blunt  Thought Process:  Goal Directed  Orientation:  Full (Time, Place, and  Person)  Thought Content:  WDL  Suicidal Thoughts:  Yes.  with intent/plan  Homicidal Thoughts:  No  Memory:  Immediate;   Fair Recent;   Fair Remote;   Fair  Judgement:  Fair  Insight:  Fair  Psychomotor Activity:  Decreased  Concentration:  Fair  Recall:  [employer] of Knowledge:Fair  Language: Fair  Akathisia:  No  Handed:  Right  AIMS (if indicated):      Assets:  Communication Skills Desire for Improvement Financial Resources/Insurance Housing Intimacy Physical Health Resilience Social Support  Sleep:  Number of Hours: 6.3  Cognition: WNL  ADL's:  Intact    COGNITIVE FEATURES THAT CONTRIBUTE TO RISK:  None    SUICIDE RISK:   Mild:  Suicidal ideation of limited frequency, intensity, duration, and specificity.  There are no identifiable plans, no associated intent, mild dysphoria and related symptoms, good self-control (both objective and subjective assessment), few other risk factors, and identifiable protective factors, including available and accessible social support.  PLAN OF CARE:Hospital admission, medication management, discharge planning.   Mr. Eilertson is a 65 year old male with a history of depression and Parkinson's dementia admitted for worsening of depression and suicidal ideation.  1. Suicidal ideation. The patient is able to contract for safety in the hospital.  2. Mood. He has been maintained on Lexapro and Seroquel. We will add BuSpar for augmentation.  3. Parkinson's disease. He is on Sinemet.  4. Lewy body dementia. He is on Aricept and Namenda.  5. Vitamin B12 deficiency we'll continue vitamin B12.  6. CAD. He is on Plavix.  7. Disposition. He will be discharged to home  with his family. He will follow up with  CBC.  I certify that inpatient services furnished can reasonably be expected to improve the patient's condition.   Orson Slick, MD 08/05/2015, 12:53 PM

## 2015-08-05 NOTE — Progress Notes (Signed)
D: Noted guarded . Visited by parents this am shift .  Patient stated slept fair last night .Stated appetite is good and energy level  Is normal. Stated concentration is good . Stated on Depression scale , hopeless and anxiety .( low 0-10 high) Denies suicidal  homicidal ideations  .  No auditory hallucinations  No pain concerns . Appropriate ADL'S. Interacting with peers and staff.  A: Encourage patient participation with unit programming . Instruction  Given on  Medication , verbalize understanding. R: Voice no other concerns. Staff continue to monitor

## 2015-08-05 NOTE — Progress Notes (Signed)
   08/05/15 1440  Clinical Encounter Type  Visited With Patient  Visit Type Behavioral Health;Initial;Spiritual support  Referral From Nurse  Consult/Referral To Chaplain  Spiritual Encounters  Spiritual Needs Emotional  Stress Factors  Patient Stress Factors Family relationships;Major life changes  Visited w/patient for life stress assessment. Pt. presented himself lucid & stable. Denied current desires for self harm. Demonstrated range of emotions from serious to humor. Chap. Nery Frappier G. [place]

## 2015-08-05 NOTE — BHH Suicide Risk Assessment (Signed)
[place] INPATIENT:  Family/Significant Other Suicide Prevention Education  Suicide Prevention Education:  Education Completed; Demetrio Gressman (wife) [phone] has been identified by the patient as the family member/significant other with whom the patient will be residing, and identified as the person(s) who will aid the patient in the event of a mental health crisis (suicidal ideations/suicide attempt).  With written consent from the patient, the family member/significant other has been provided the following suicide prevention education, prior to the and/or following the discharge of the patient.  The suicide prevention education provided includes the following:  Suicide risk factors  Suicide prevention and interventions  National Suicide Hotline telephone number  [facility] assessment telephone number  [facility] Emergency Assistance [place] and/or Residential Mobile Crisis Unit telephone number  Request made of family/significant other to:  Remove weapons (e.g., guns, rifles, knives), all items previously/currently identified as safety concern.    Remove drugs/medications (over-the-counter, prescriptions, illicit drugs), all items previously/currently identified as a safety concern.  The family member/significant other verbalizes understanding of the suicide prevention education information provided.  The family member/significant other agrees to remove the items of safety concern listed above.  Keene Breath, MSW, Latanya Presser [phone] 08/05/2015, 4:05 PM

## 2015-08-05 NOTE — H&P (Signed)
Psychiatric Admission Assessment Adult  Patient Identification: AKILI CORSETTI MRN:  [id number] Date of Evaluation:  08/05/2015 Chief Complaint:  Depression Principal Diagnosis: Major depressive disorder, recurrent severe without psychotic features ([place]) Diagnosis:   Patient Active Problem List   Diagnosis Date Noted  . Parkinson disease ([place]) [G20] 08/04/2015  . Suicidal ideation [R45.851] 08/04/2015  . Major depressive disorder, recurrent severe without psychotic features ([place]) [F33.2] 08/04/2015  . Lewy body dementia [G31.83, F02.80] 08/04/2015   History of Present Illness:  Identifying data.. Mr. Paternoster is a 65 year old male with a history of depression, Parkinson's disease, and cognitive decline.  Chief complaint. "I wanted to talk to someone."  History of present illness. Information was obtained from the patient and the chart.  Mr. Hohmann did not have any problems with mental illness until 8 years ago when he was diagnosed with Parkinson's disease and Lewy body dementia. He became increasingly depressed and was started on Lexapro by his primary provider. In recent weeks he notices worsening of depression with decreased appetite, poor sleep, anhedonia, feeling of guilt and hopelessness worthlessness, poor energy and concentration, social isolation and crying status. He started having passing thoughts of suicide. He feels that everybody would be better off without him. He feels that his family doesn't love him. He feels that his wife never speaks to him. He was thinking about ending it all. He shares his worries with the wife however and at her insistence he came to the emergency room for help. The patient denies symptoms of psychosis or symptoms suggestive of bipolar mania. He does not use alcohol or drugs. His anxiety is slightly higher in the past few days but he denies symptoms of OCD, PTSD or panic attacks.  Past psychiatric history. He has never been hospitalized. There were no suicide  attempts. Lexapro is the only medication he has ever tried. Reportedly he was diagnosed with Lewy body dementia first and Parkinson's disease next. He does report that he has not been driving for the past 8 years because he gets anxious and lost. He experiences some mild cognitive problems but his memory has not been worsening in both this way in the past 8 years. I "questioned diagnosis of dementia.  Family psychiatric history. Nonreported.  Social history. 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 He is extremely worried about his cognitive decline. He lives with his wife and 28 year old son. He has other children and grandchildren.  Total Time spent with patient: 1 hour  Past Psychiatric History:  depression Parkinson's disease.  Risk to Self: Is patient at risk for suicide?: Yes Risk to Others:   Prior Inpatient Therapy:   Prior Outpatient Therapy:    Alcohol Screening: Patient refused Alcohol Screening Tool: Yes 1. How often do you have a drink containing alcohol?: Never 2. How many drinks containing alcohol do you have on a typical day when you are drinking?: 1 or 2 3. How often do you have six or more drinks on one occasion?: Never Preliminary Score: 0 4. How often during the last year have you found that you were not able to stop drinking once you had started?: Never 5. How often during the last year have you failed to do what was normally expected from you becasue of drinking?: Never 6. How often during the last year have you needed a first drink in the morning to get yourself going after a heavy drinking session?: Never 7. How often during the last year have you had a  feeling of guilt of remorse after drinking?: Never 8. How often during the last year have you been unable to remember what happened the night before because you had been drinking?: Never 9. Have you or someone else been injured as a result of your drinking?: No 10. Has a relative or friend or a doctor or  another health worker been concerned about your drinking or suggested you cut down?: No Alcohol Use Disorder Identification Test Final Score (AUDIT): 0 Brief Intervention: Yes Substance Abuse History in the last 12 months:  No. Consequences of Substance Abuse: NA Previous Psychotropic Medications: Yes  Psychological Evaluations: No  Past Medical History:  Past Medical History  Diagnosis Date  . Parkinson disease ([place])   . TIA (transient ischemic attack)   . Dementia     Past Surgical History  Procedure Laterality Date  . Cholecystectomy    . Leaky valve repair in bilateral le     Family History: History reviewed. No pertinent family history. Family Psychiatric  History:  none reported. Social History:  History  Alcohol Use No     History  Drug Use Not on file    Social History   Social History  . Marital Status: Married    Spouse Name: N/A  . Number of Children: N/A  . Years of Education: N/A   Social History Main Topics  . Smoking status: Former Research scientist (life sciences)  . Smokeless tobacco: None  . Alcohol Use: No  . Drug Use: None  . Sexual Activity: Not Asked   Other Topics Concern  . None   Social History Narrative   Additional Social History:                         Allergies:  No Known Allergies Lab Results:  Results for orders placed or performed during the hospital encounter of 08/04/15 (from the past 48 hour(s))  CBC with Differential     Status: None   Collection Time: 08/04/15 11:25 AM  Result Value Ref Range   WBC 5.1 3.8 - 10.6 K/uL   RBC 5.14 4.40 - 5.90 MIL/uL   Hemoglobin 15.2 13.0 - 18.0 g/dL   HCT 46.4 40.0 - 52.0 %   MCV 90.2 80.0 - 100.0 fL   MCH 29.6 26.0 - 34.0 pg   MCHC 32.8 32.0 - 36.0 g/dL   RDW 13.9 11.5 - 14.5 %   Platelets 234 150 - 440 K/uL   Neutrophils Relative % 67 %   Neutro Abs 3.4 1.4 - 6.5 K/uL   Lymphocytes Relative 23 %   Lymphs Abs 1.2 1.0 - 3.6 K/uL   Monocytes Relative 8 %   Monocytes Absolute 0.4 0.2 - 1.0 K/uL    Eosinophils Relative 1 %   Eosinophils Absolute 0.1 0 - 0.7 K/uL   Basophils Relative 1 %   Basophils Absolute 0.0 0 - 0.1 K/uL  Comprehensive metabolic panel     Status: Abnormal   Collection Time: 08/04/15 11:25 AM  Result Value Ref Range   Sodium 138 135 - 145 mmol/L   Potassium 4.1 3.5 - 5.1 mmol/L   Chloride 101 101 - 111 mmol/L   CO2 26 22 - 32 mmol/L   Glucose, Bld 102 (H) 65 - 99 mg/dL   BUN 14 6 - 20 mg/dL   Creatinine, Ser 0.91 0.61 - 1.24 mg/dL   Calcium 9.4 8.9 - 10.3 mg/dL   Total Protein 7.8 6.5 - 8.1 g/dL   Albumin  4.9 3.5 - 5.0 g/dL   AST 20 15 - 41 U/L   ALT 14 (L) 17 - 63 U/L   Alkaline Phosphatase 64 38 - 126 U/L   Total Bilirubin 1.4 (H) 0.3 - 1.2 mg/dL   GFR calc non Af Amer >60 >60 mL/min   GFR calc Af Amer >60 >60 mL/min    Comment: (NOTE) The eGFR has been calculated using the CKD EPI equation. This calculation has not been validated in all clinical situations. eGFR's persistently <60 mL/min signify possible Chronic Kidney Disease.    Anion gap 11 5 - 15  Acetaminophen level     Status: Abnormal   Collection Time: 08/04/15 11:25 AM  Result Value Ref Range   Acetaminophen (Tylenol), Serum <10 (L) 10 - 30 ug/mL    Comment:        THERAPEUTIC CONCENTRATIONS VARY SIGNIFICANTLY. A RANGE OF 10-30 ug/mL MAY BE AN EFFECTIVE CONCENTRATION FOR MANY PATIENTS. HOWEVER, SOME ARE BEST TREATED AT CONCENTRATIONS OUTSIDE THIS RANGE. ACETAMINOPHEN CONCENTRATIONS >150 ug/mL AT 4 HOURS AFTER INGESTION AND >50 ug/mL AT 12 HOURS AFTER INGESTION ARE OFTEN ASSOCIATED WITH TOXIC REACTIONS.   Salicylate level     Status: None   Collection Time: 08/04/15 11:25 AM  Result Value Ref Range   Salicylate Lvl <2.8 2.8 - 30.0 mg/dL  Ethanol     Status: None   Collection Time: 08/04/15 11:25 AM  Result Value Ref Range   Alcohol, Ethyl (B) <5 <5 mg/dL    Comment:        LOWEST DETECTABLE LIMIT FOR SERUM ALCOHOL IS 5 mg/dL FOR MEDICAL PURPOSES ONLY   Urine Drug  Screen, Qualitative ([facility] only)     Status: None   Collection Time: 08/04/15  3:10 PM  Result Value Ref Range   Tricyclic, Ur Screen NONE DETECTED NONE DETECTED   Amphetamines, Ur Screen NONE DETECTED NONE DETECTED   MDMA (Ecstasy)Ur Screen NONE DETECTED NONE DETECTED   Cocaine Metabolite,Ur [place] NONE DETECTED NONE DETECTED   Opiate, Ur Screen NONE DETECTED NONE DETECTED   Phencyclidine (PCP) Ur S NONE DETECTED NONE DETECTED   Cannabinoid 50 Ng, Ur [place] NONE DETECTED NONE DETECTED   Barbiturates, Ur Screen NONE DETECTED NONE DETECTED   Benzodiazepine, Ur Scrn NONE DETECTED NONE DETECTED   Methadone Scn, Ur NONE DETECTED NONE DETECTED    Comment: (NOTE) 315  Tricyclics, urine               Cutoff 1000 ng/mL 200  Amphetamines, urine             Cutoff 1000 ng/mL 300  MDMA (Ecstasy), urine           Cutoff 500 ng/mL 400  Cocaine Metabolite, urine       Cutoff 300 ng/mL 500  Opiate, urine                   Cutoff 300 ng/mL 600  Phencyclidine (PCP), urine      Cutoff 25 ng/mL 700  Cannabinoid, urine              Cutoff 50 ng/mL 800  Barbiturates, urine             Cutoff 200 ng/mL 900  Benzodiazepine, urine           Cutoff 200 ng/mL 1000 Methadone, urine                Cutoff 300 ng/mL 1100 1200 The urine drug screen provides only  a preliminary, unconfirmed 1300 analytical test result and should not be used for non-medical 1400 purposes. Clinical consideration and professional judgment should 1500 be applied to any positive drug screen result due to possible 1600 interfering substances. A more specific alternate chemical method 1700 must be used in order to obtain a confirmed analytical result.  1800 Gas chromato graphy / mass spectrometry (GC/MS) is the preferred 1900 confirmatory method.   Urinalysis complete, with microscopic ([facility] only)     Status: Abnormal   Collection Time: 08/04/15  3:10 PM  Result Value Ref Range   Color, Urine STRAW (A) YELLOW   APPearance CLEAR (A) CLEAR    Glucose, UA NEGATIVE NEGATIVE mg/dL   Bilirubin Urine NEGATIVE NEGATIVE   Ketones, ur NEGATIVE NEGATIVE mg/dL   Specific Gravity, Urine 1.004 (L) 1.005 - 1.030   Hgb urine dipstick NEGATIVE NEGATIVE   pH 7.0 5.0 - 8.0   Protein, ur NEGATIVE NEGATIVE mg/dL   Nitrite NEGATIVE NEGATIVE   Leukocytes, UA NEGATIVE NEGATIVE   RBC / HPF 0-5 0 - 5 RBC/hpf   WBC, UA 0-5 0 - 5 WBC/hpf   Bacteria, UA NONE SEEN NONE SEEN   Squamous Epithelial / LPF NONE SEEN NONE SEEN    Metabolic Disorder Labs:  No results found for: HGBA1C, MPG No results found for: PROLACTIN No results found for: CHOL, TRIG, HDL, CHOLHDL, VLDL, LDLCALC  Current Medications: Current Facility-Administered Medications  Medication Dose Route Frequency Provider Last Rate Last Dose  . acetaminophen (TYLENOL) tablet 650 mg  650 mg Oral Q6H PRN Gonzella Lex, MD      . alum & mag hydroxide-simeth (MAALOX/MYLANTA) 200-200-20 MG/5ML suspension 30 mL  30 mL Oral Q4H PRN Gonzella Lex, MD      . busPIRone (BUSPAR) tablet 5 mg  5 mg Oral BID Clovis Fredrickson, MD   5 mg at 08/05/15 1232  . carbidopa-levodopa (SINEMET CR) 50-200 MG per tablet controlled release 1 tablet  1 tablet Oral BID Gonzella Lex, MD   1 tablet at 08/05/15 0829  . clonazePAM (KLONOPIN) tablet 1 mg  1 mg Oral QHS Gonzella Lex, MD   1 mg at 08/04/15 2155  . clopidogrel (PLAVIX) tablet 75 mg  75 mg Oral Daily Gonzella Lex, MD   75 mg at 08/05/15 0828  . cyanocobalamin tablet 1,000 mcg  1,000 mcg Oral Daily Clovis Fredrickson, MD   1,000 mcg at 08/05/15 0828  . donepezil (ARICEPT) tablet 10 mg  10 mg Oral QHS Gonzella Lex, MD   10 mg at 08/04/15 2155  . escitalopram (LEXAPRO) tablet 20 mg  20 mg Oral Daily Gonzella Lex, MD   20 mg at 08/05/15 0828  . magnesium hydroxide (MILK OF MAGNESIA) suspension 30 mL  30 mL Oral Daily PRN Gonzella Lex, MD      . memantine [facility]) tablet 10 mg  10 mg Oral BID Gonzella Lex, MD   10 mg at 08/05/15 4193  .  QUEtiapine (SEROQUEL) tablet 25 mg  25 mg Oral QPC supper Gonzella Lex, MD       PTA Medications: Prescriptions prior to admission  Medication Sig Dispense Refill Last Dose  . carbidopa-levodopa (SINEMET CR) 50-200 MG tablet Take 1 tablet by mouth 2 (two) times daily.     . clonazePAM (KLONOPIN) 1 MG tablet Take 1 mg by mouth at bedtime.     . clopidogrel (PLAVIX) 75 MG tablet Take 75 mg by mouth  daily.   08/04/2015 at Unknown time  . donepezil (ARICEPT) 10 MG tablet Take 10 mg by mouth daily before breakfast.   08/04/2015 at Unknown time  . escitalopram (LEXAPRO) 20 MG tablet Take 20 mg by mouth daily.   08/04/2015 at Unknown time  . memantine (NAMENDA) 10 MG tablet Take 10 mg by mouth 2 (two) times daily.   08/04/2015 at Unknown time  . Multiple Vitamin (MULTIVITAMIN) capsule Take 1 capsule by mouth daily.   08/04/2015 at Unknown time  . QUEtiapine (SEROQUEL) 25 MG tablet Take 25 mg by mouth daily. At 1700   08/03/2015 at Unknown time  . vitamin B-12 (CYANOCOBALAMIN) 1000 MCG tablet Take 1,000 mcg by mouth daily.   08/04/2015 at Unknown time    Musculoskeletal: Strength & Muscle Tone: within normal limits Gait & Station: normal Patient leans: N/A  Psychiatric Specialty Exam: I reviewed physical exam performed in the emergency room with the findings. Physical Exam  Nursing note and vitals reviewed.   Review of Systems  Psychiatric/Behavioral: Positive for memory loss.  All other systems reviewed and are negative.   Blood pressure 111/73, pulse 49, temperature 97.7 F (36.5 C), temperature source Oral, resp. rate 18, height 5' 8"  (1.727 m), weight 74.844 kg (165 lb), SpO2 99 %.Body mass index is 25.09 kg/(m^2).  See SRA.                                                  Sleep:  Number of Hours: 6.3     Treatment Plan Summary: Daily contact with patient to assess and evaluate symptoms and progress in treatment and Medication management   Mr. Vitolo is a  65 year old male with a history of depression and Parkinson's dementia admitted for worsening of depression and suicidal ideation.  1. Suicidal ideation. The patient is able to contract for safety in the hospital.  2. Mood. He has been maintained on Lexapro and Seroquel. We will add BuSpar for augmentation.  3. Parkinson's disease. He is on Sinemet.  4. Lewy body dementia. He is on Aricept and Namenda.  5. Vitamin B12 deficiency we'll continue vitamin B12.  6. CAD. He is on Plavix.  7. Disposition. He will be discharged to home with his family. He will follow up with  CBC.  Observation Level/Precautions:  15 minute checks  Laboratory:  CBC Chemistry Profile UDS UA  Psychotherapy:    Medications:    Consultations:    Discharge Concerns:    Estimated LOS:  Other:     I certify that inpatient services furnished can reasonably be expected to improve the patient's condition.    Charles Niese 1/26/20171:03 PM

## 2015-08-05 NOTE — Progress Notes (Signed)
Recreation Therapy Notes  INPATIENT RECREATION THERAPY ASSESSMENT  Patient Details Name: Darren Wang MRN: [id number] DOB: 02/25/1951 Today's Date: 08/05/2015  Patient Stressors: Relationship, Friends, Work, Other (Comment) (Wife fusses at him; lack of supportive friends; lost job when he got his dementia diagnosis; gets lonely)  Coping Skills:   Arguments, Exercise, Talking, Art/Dance, Music, Sports, Other (Comment) (Take care of animals, do things outside)  Personal Challenges: Communication, Concentration, Decision-Making, Expressing Yourself, Problem-Solving, Relationships, Self-Esteem/Confidence, Stress Management, Trusting Others  Leisure Interests (2+):  Lawyer, Sports - Conservator, museum/gallery Resources:  Yes  Community Resources:  Library, Other (Comment) ([place])  Current Use: No  If no, Barriers?: Other (Comment) (Doesn't thik about going to [employer]; Rec center doesn't have director for the senior citizens activities at the moment)  Patient Strengths:  Kind, loving  Patient Identified Areas of Improvement:  Love  Current Recreation Participation:  Take a walk  Patient Goal for Hospitalization:  Go to groups and get out of here  [place] of Residence:  [place] of Residence:  [place]   Current SI (including self-harm):  No  Current HI:  No  Consent to Intern Participation: N/A   Leonette Monarch, LRT/CTRS 08/05/2015, 2:34 PM

## 2015-08-05 NOTE — BHH Group Notes (Signed)
[facility] Group Notes:  (Nursing/MHT/Case Management/Adjunct)  Date:  08/05/2015  Time:  8:49 AM  Type of Therapy:  Community Group   Participation Level:  Did Not Attend  Yadhira Mckneely De'Chelle Olena Willy 08/05/2015, 8:49 AM

## 2015-08-05 NOTE — Tx Team (Signed)
Interdisciplinary Treatment Plan Update (Adult)  Date:  08/05/2015 Time Reviewed:  4:08 PM  Progress in Treatment: Attending groups: Yes. Participating in groups:  Yes. Taking medication as prescribed:  Yes. Tolerating medication:  Yes. Family/Significant othe contact made:  Yes, individual(s) contacted:  patient's wife Patient understands diagnosis:  Yes. Discussing patient identified problems/goals with staff:  Yes. Medical problems stabilized or resolved:  Yes. Denies suicidal/homicidal ideation: Yes. Issues/concerns per patient self-inventory:  Yes. Other:  New problem(s) identified: No, Describe:  none reported  Discharge Plan or Barriers:Patient will stabilize on medications and discharge home with family. Patient will need outpatient follow up scheduled in [facility] area  Reason for Continuation of Hospitalization: Depression Medication stabilization Suicidal ideation  Comments:  Estimated length of stay: up to 5 days expected discharge Tuesday 08/10/15  New goal(s):  Review of initial/current patient goals per problem list:   1.  Goal(s):participate in aftercare plan  Met:  No  Target date:by discharge  As evidenced [id number] will participate in aftercare plan AEB aftercare provider and housing plan identified at discharge  2.  Goal (s):decrease depression  Met:  No  Target date: by discharge  As evidenced [id number] demonstrates decreased signs of depression AEB decreased symptoms of depression and a Depression rating of 3 or less   Attendees: Physician:  Orson Slick, MD 1/26/20174:08 PM  Nursing:   Polly Cobia, RN 1/26/20174:08 PM  Other:  Carmell Austria, [place] 1/26/20174:08 PM  Other:  Marylou Flesher, [place] 1/26/20174:08 PM  Other:  Bonnye Fava, [place] 1/26/20174:08 PM  Other: Everitt Amber, [place] 1/26/20174:08 PM  Other: Christa See, [place] Intern 1/26/20174:08 PM  Other: Levada Schilling, [place] Student 1/26/20174:08 PM  Other:  1/26/20174:08 PM   Other:  1/26/20174:08 PM  Other:  1/26/20174:08 PM  Other:   1/26/20174:08 PM   Scribe for Treatment Team:   Keene Breath, MSW, [place] [phone]  08/05/2015, 4:08 PM

## 2015-08-06 DIAGNOSIS — F332 Major depressive disorder, recurrent severe without psychotic features: Principal | ICD-10-CM

## 2015-08-06 LAB — LIPID PANEL
CHOLESTEROL: 236 mg/dL — AB (ref 0–200)
HDL: 48 mg/dL (ref >40)
LDL Cholesterol: 164 mg/dL — ABNORMAL HIGH (ref 0–99)
Total CHOL/HDL Ratio: 4.9 RATIO
Triglycerides: 119 mg/dL (ref <150)
VLDL: 24 mg/dL (ref 0–40)

## 2015-08-06 LAB — HEMOGLOBIN A1C: HEMOGLOBIN A1C: 5.4 % (ref 4.0–6.0)

## 2015-08-06 LAB — TSH: TSH: 1.509 u[IU]/mL (ref 0.350–4.500)

## 2015-08-06 MED ORDER — BUSPIRONE HCL 5 MG PO TABS: 5.0000 mg | ORAL_TABLET | Freq: Two times a day (BID) | ORAL | Status: DC

## 2015-08-06 NOTE — Plan of Care (Signed)
Problem: [facility] Participation in Recreation Therapeutic Interventions Goal: STG-Patient will demonstrate improved self esteem by identif STG: Self-Esteem - Within 3 treatment sessions, patient will verbalize at least 5 positive affirmation statements in one treatment session to increase self-esteem post d/c.  Outcome: Completed/Met Date Met:  08/06/15 Treatment Session 1; Completed 1 out of 1: At approximately 11:55 am, LRT met with patient in patient room. Patient verbalized 5 positive affirmation statements. Patient reported it felt "good". LRT encouraged patient to continue saying positive affirmation statements. Intervention Used: I Am statements  Leonette Monarch, LRT/CTRS 01.27.17 12:46 pm Goal: STG-Other Recreation Therapy Goal (Specify) STG: Stress Management - Within 3 treatment sessions, patient will verbalize understanding of the stress management techniques in one treatment session to increase stress management skills post d/c.  Outcome: Completed/Met Date Met:  08/06/15 Treatment Session 1; Completed 1 out of 1: At approximately 11:55 am, LRT met with patient in patient room. LRT educated and provided patient with handouts on stress management techniques. Patient verbalized understanding. LRT encouraged patient to read over and practice the stress management techniques.  Intervention Used: Stress Management handouts  Leonette Monarch, LRT/CTRS 01.27.17 12:47 pm

## 2015-08-06 NOTE — Plan of Care (Signed)
Problem: Alteration in mood Goal: STG-Patient is able to discuss feelings and issues (Patient is able to discuss feelings and issues leading to depression)  Outcome: Progressing Pt discussed feelings of loneliness and "not hearing kind words", as contributing to his depression.

## 2015-08-06 NOTE — Tx Team (Signed)
Interdisciplinary Treatment Plan Update (Adult)  Date:  08/06/2015 Time Reviewed:  1:58 PM  Progress in Treatment: Attending groups: Yes. Participating in groups:  Yes. Taking medication as prescribed:  Yes. Tolerating medication:  Yes. Family/Significant othe contact made:  Yes, individual(s) contacted:  patient's wife Patient understands diagnosis:  Yes. Discussing patient identified problems/goals with staff:  Yes. Medical problems stabilized or resolved:  Yes. Denies suicidal/homicidal ideation: Yes. Issues/concerns per patient self-inventory:  Yes. Other:  New problem(s) identified: No, Describe:  none reported  Discharge Plan or Barriers:Patient will stabilize on medications and discharge home with family. Patient will need outpatient follow up scheduled in [place] area  Reason for Continuation of Hospitalization: Depression Medication stabilization Suicidal ideation  Comments:  Estimated length of stay: 0 days will discharge today Friday 08/06/15  New goal(s):  Review of initial/current patient goals per problem list:   1.  Goal(s):participate in aftercare plan  Met:  Yes  Target date:by discharge  As evidenced by:patient will participate in aftercare plan AEB aftercare provider and housing plan identified at discharge  08/06/15: patient plans to discharge home with his wife and has follow up arranged for outpatient services at [facility] in . 2.  Goal (s):decrease depression  Met:  Yes  Target date: by discharge  As evidenced by:patient demonstrates decreased signs of depression AEB decreased symptoms of depression and a Depression rating of 3 or less 08/06/15:patient reports a depression rating of 0 and reports no SI at discharge  Attendees: Physician:  Jolanta Pucilowska, MD 1/27/20171:58 PM  Nursing:   Andrea Breedlove, RN 1/27/20171:58 PM  Other:   , LCSWA 1/27/20171:58 PM  Other:   1/27/20171:58 PM  Other:   1/27/20171:58 PM  Other:   1/27/20171:58 PM  Other:  1/27/20171:58 PM  Other:  1/27/20171:58 PM  Other:  1/27/20171:58 PM  Other:  1/27/20171:58 PM  Other:  1/27/20171:58 PM  Other:   1/27/20171:58 PM   Scribe for Treatment Team:   ,  T, MSW, LCSWA [phone]  08/06/2015, 1:58 PM  

## 2015-08-06 NOTE — Discharge Summary (Signed)
Physician Discharge Summary Note  Patient:  Darren Wang is an 65 y.o., male MRN:  [id number] DOB:  1951/05/30 Patient phone:  [phone] (home)  Patient address:   [address] [place] [address],  Total Time spent with patient: 30 minutes  Date of Admission:  08/04/2015 Date of Discharge: 08/06/2015  Reason for Admission:  Suicidal ideation.  Identifying data.. Darren Wang is a 65 year old male with a history of depression, Parkinson's disease, and cognitive decline.  Chief complaint. "I wanted to talk to someone."  History of present illness. Information was obtained from the patient and the chart. Darren Wang did not have any problems with mental illness until 8 years ago when he was diagnosed with Parkinson's disease and Lewy body dementia. He became increasingly depressed and was started on Lexapro by his primary provider. In recent weeks he notices worsening of depression with decreased appetite, poor sleep, anhedonia, feeling of guilt and hopelessness worthlessness, poor energy and concentration, social isolation and crying status. He started having passing thoughts of suicide. He feels that everybody would be better off without him. He feels that his family doesn't love him. He feels that his wife never speaks to him. He was thinking about ending it all. He shares his worries with the wife however and at her insistence he came to the emergency room for help. The patient denies symptoms of psychosis or symptoms suggestive of bipolar mania. He does not use alcohol or drugs. His anxiety is slightly higher in the past few days but he denies symptoms of OCD, PTSD or panic attacks.  Past psychiatric history. He has never been hospitalized. There were no suicide attempts. Lexapro is the only medication he has ever tried. Reportedly he was diagnosed with Lewy body dementia first and Parkinson's disease next. He does report that he has not been driving for the past 8 years because he gets  anxious and lost. He experiences some mild cognitive problems but his memory has not been worsening in both this way in the past 8 years. I "questioned diagnosis of dementia.  Family psychiatric history. Nonreported.  Social history. 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 He is extremely worried about his cognitive decline. He lives with his wife and 63 year old son. He has other children and grandchildren.  Principal Problem: Major depressive disorder, recurrent severe without psychotic features [facility]) Discharge Diagnoses: Patient Active Problem List   Diagnosis Date Noted  . Parkinson disease ([place]) [G20] 08/04/2015  . Suicidal ideation [R45.851] 08/04/2015  . Major depressive disorder, recurrent severe without psychotic features ([place]) [F33.2] 08/04/2015  . Lewy body dementia [G31.83, F02.80] 08/04/2015    Past Psychiatric History: depression.  Past Medical History:  Past Medical History  Diagnosis Date  . Parkinson disease ([place])   . TIA (transient ischemic attack)   . Dementia     Past Surgical History  Procedure Laterality Date  . Cholecystectomy    . Leaky valve repair in bilateral le     Family History: History reviewed. No pertinent family history. Family Psychiatric  History: none reported. Social History:  History  Alcohol Use No     History  Drug Use Not on file    Social History   Social History  . Marital Status: Married    Spouse Name: N/A  . Number of Children: N/A  . Years of Education: N/A   Social History Main Topics  . Smoking status: Former Research scientist (life sciences)  . Smokeless tobacco: None  . Alcohol Use:  No  . Drug Use: None  . Sexual Activity: Not Asked   Other Topics Concern  . None   Social History Narrative    Hospital Course:    Darren Wang is a 65 year old male with a history of depression and Parkinson's dementia admitted for worsening of depression and suicidal ideation.  1. Suicidal ideation. This has resolved. The patient is able  to contract for safety.  2. Mood. He has been maintained on Lexapro and Seroquel in the community. We added BuSpar for augmentation.  3. Parkinson's disease. He is on Sinemet.  4. Lewy body dementia. He is on Aricept and Namenda.  5. Vitamin B12 deficiency. We continued vitamin B12.  6. CAD. He is on Plavix.  7. Disposition. He was discharged to home with his family. He will follow up with [facility].  Physical Findings: AIMS:  , ,  ,  ,    CIWA:    COWS:     Musculoskeletal: Strength & Muscle Tone: within normal limits Gait & Station: normal Patient leans: N/A  Psychiatric Specialty Exam: Review of Systems  Psychiatric/Behavioral: Positive for memory loss.  All other systems reviewed and are negative.   Blood pressure 111/73, pulse 49, temperature 97.7 F (36.5 C), temperature source Oral, resp. rate 18, height 5\' 8"  (1.727 m), weight 74.844 kg (165 lb), SpO2 99 %.Body mass index is 25.09 kg/(m^2).  See SRA.                                                  Sleep:  Number of Hours: 6.3   Have you used any form of tobacco in the last 30 days? (Cigarettes, Smokeless Tobacco, Cigars, and/or Pipes): No  Has this patient used any form of tobacco in the last 30 days? (Cigarettes, Smokeless Tobacco, Cigars, and/or Pipes) Yes, No  Metabolic Disorder Labs:  No results found for: HGBA1C, MPG No results found for: PROLACTIN No results found for: CHOL, TRIG, HDL, CHOLHDL, VLDL, LDLCALC  See Psychiatric Specialty Exam and Suicide Risk Assessment completed by Attending Physician prior to discharge.  Discharge destination:  Home  Is patient on multiple antipsychotic therapies at discharge:  No   Has Patient had three or more failed trials of antipsychotic monotherapy by history:  No  Recommended Plan for Multiple Antipsychotic Therapies: NA  Discharge Instructions    Diet - low sodium heart healthy    Complete by:  As directed      Increase activity slowly     Complete by:  As directed             Medication List    TAKE these medications      Indication   busPIRone 5 MG tablet  Commonly known as:  BUSPAR  Take 1 tablet (5 mg total) by mouth 2 (two) times daily.   Indication:  Depression     carbidopa-levodopa 50-200 MG tablet  Commonly known as:  SINEMET CR  Take 1 tablet by mouth 2 (two) times daily.      clonazePAM 1 MG tablet  Commonly known as:  KLONOPIN  Take 1 mg by mouth at bedtime.      clopidogrel 75 MG tablet  Commonly known as:  PLAVIX  Take 75 mg by mouth daily.      donepezil 10 MG tablet  Commonly known as:  ARICEPT  Take 10 mg by mouth daily before breakfast.      escitalopram 20 MG tablet  Commonly known as:  LEXAPRO  Take 20 mg by mouth daily.      memantine 10 MG tablet  Commonly known as:  NAMENDA  Take 10 mg by mouth 2 (two) times daily.      multivitamin capsule  Take 1 capsule by mouth daily.      QUEtiapine 25 MG tablet  Commonly known as:  SEROQUEL  Take 25 mg by mouth daily. At 1700      vitamin B-12 1000 MCG tablet  Commonly known as:  CYANOCOBALAMIN  Take 1,000 mcg by mouth daily.          Follow-up recommendations:  Activity:  as tolerated. Diet:  low sodium heart healthy Other:  keep follow up appointment  Comments:    Signed: Jolanta Pucilowska 08/06/2015, 6:47 AM
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
## 2015-08-06 NOTE — Progress Notes (Signed)
D:  Per pt self inventory pt reports sleeping good, appetite good, energy level normal, ability to pay attention good, rates depression at a 1 out of 10, hopelessness at a 0 out of 10, anxiety at a 2 out of 10, denies SI/HI/AVH, goal today: "go to group"     A:  Emotional support provided, Encouraged pt to continue with treatment plan and attend all group activities, q15 min checks maintained for safety.  R:  Pt is receptive, going to groups, pleasant and cooperative with staff and other patients on the unit.

## 2015-08-06 NOTE — BHH Suicide Risk Assessment (Signed)
[facility] Discharge Suicide Risk Assessment   Principal Problem: Major depressive disorder, recurrent severe without psychotic features [facility]) Discharge Diagnoses:  Patient Active Problem List   Diagnosis Date Noted  . Parkinson disease ([place]) [G20] 08/04/2015  . Suicidal ideation [R45.851] 08/04/2015  . Major depressive disorder, recurrent severe without psychotic features ([place]) [F33.2] 08/04/2015  . Lewy body dementia [G31.83, F02.80] 08/04/2015    Total Time spent with patient: 30 minutes  Musculoskeletal: Strength & Muscle Tone: within normal limits Gait & Station: normal Patient leans: N/A  Psychiatric Specialty Exam: Review of Systems  Psychiatric/Behavioral: Positive for depression and memory loss.  All other systems reviewed and are negative.   Blood pressure 111/73, pulse 49, temperature 97.7 F (36.5 C), temperature source Oral, resp. rate 18, height 5\' 8"  (1.727 m), weight 74.844 kg (165 lb), SpO2 99 %.Body mass index is 25.09 kg/(m^2).  General Appearance: Casual  Eye Contact::  Good  Speech:  Clear and [id number]  Volume:  Normal  Mood:  Anxious  Affect:  Appropriate  Thought Process:  Goal Directed  Orientation:  Full (Time, Place, and Person)  Thought Content:  WDL  Suicidal Thoughts:  No  Homicidal Thoughts:  No  Memory:  Immediate;   Fair Recent;   Fair Remote;   Fair  Judgement:  Fair  Insight:  Fair  Psychomotor Activity:  Normal  Concentration:  Fair  Recall:  [employer] [place]  Language: Fair  Akathisia:  No  Handed:  Right  AIMS (if indicated):     Assets:  Communication Skills Desire for Improvement Financial Resources/Insurance Housing Resilience Social Support  Sleep:  Number of Hours: 6.3  Cognition: WNL  ADL's:  Intact   Mental Status Per Nursing Assessment::   On Admission:     Demographic Factors:  Male and Caucasian  Loss Factors: Decrease in vocational status and Decline in physical health  Historical  Factors: NA  Risk Reduction Factors:   Sense of responsibility to family, Living with another person, especially a relative, Positive social support and Positive therapeutic relationship  Continued Clinical Symptoms:  Depression:   Severe Medical Diagnoses and Treatments/Surgeries  Cognitive Features That Contribute To Risk:  None    Suicide Risk:  Minimal: No identifiable suicidal ideation.  Patients presenting with no risk factors but with morbid ruminations; may be classified as minimal risk based on the severity of the depressive symptoms    Plan Of Care/Follow-up recommendations:  Activity:  as tolerated Diet:  low sodium heart healthy. Other:  keep follow up appointments.  Orson Slick, MD 08/06/2015, 6:57 AM

## 2015-08-06 NOTE — Progress Notes (Signed)
D: Observed pt in dayroom. Patient alert and oriented x4. Patient denies SI/HI/AVH. Pt affect is depressed and blunted. Pt stated "I felt lonely and depressed..not hearing kind words" as what caused him to have thoughts of "not wanting to live anymore." Patient rated depression 2/10 and denies anxiety. Pt stated "I feel pretty good" when discussing mood. Pt indicated that going to groups has helped make him feel less depressed.  A: Offered active listening and support. Provided therapeutic communication. Administered scheduled medications. Encouraged pt to continue attending groups.  R: Pt pleasant and cooperative. Pt medication compliant. Will continue Q15 min. checks. Safety maintained.

## 2015-08-06 NOTE — Progress Notes (Signed)
Recreation Therapy Notes  INPATIENT RECREATION TR PLAN  Patient Details Name: Darren Wang MRN: [id number] DOB: 1950/09/04 Today's Date: 08/06/2015  Rec Therapy Plan Is patient appropriate for Therapeutic Recreation?: Yes Treatment times per week: At least once a week TR Treatment/Interventions: 1:1 session, Group participation (Comment) (Appropriate participation in daily recreation therapy tx)  Discharge Criteria Pt will be discharged from therapy if:: Treatment goals are met, Discharged Treatment plan/goals/alternatives discussed and agreed upon by:: Patient/family  Discharge Summary Short term goals set: See Care Plan Short term goals met: Complete Progress toward goals comments: One-to-one attended Which groups?: Leisure education One-to-one attended: Self-esteem, stress management Reason goals not met: N/A Therapeutic equipment acquired: None Reason patient discharged from therapy: Discharge from hospital Pt/family agrees with progress & goals achieved: Yes Date patient discharged from therapy: 08/06/15   Leonette Monarch, LRT/CTRS 08/06/2015, 12:48 PM

## 2015-08-06 NOTE — Progress Notes (Signed)
  [facility] Adult Case Management Discharge Plan :  Will you be returning to the same living situation after discharge:  Yes,  home with wife At discharge, do you have transportation home?: Yes,  patient's wife will pick up Do you have the ability to pay for your medications: Yes,  patient has Medicare  Release of information consent forms completed and in the chart;  Patient's signature needed at discharge.  Patient to Follow up at: Follow-up Information    Follow up with [place], [place] . Go on 08/13/2015.   Why:  For follow-up care appt Friday 08/13/15 at 1:00pm (bring hosptial discharge documents, ID, and insurance card). Strict no show policy so give 24 hour notice of cancelation or appt changes.   Contact information:   [address] [place], [place] [address] Ph [phone] Fax [phone]       Next level of care provider has access to [place] and Suicide Prevention discussed: Yes,  SPE discussed with patient and Nevyn Garg (wife) [phone]   Have you used any form of tobacco in the last 30 days? (Cigarettes, Smokeless Tobacco, Cigars, and/or Pipes): No  Has patient been referred to the Quitline?: N/A patient is not a smoker  Patient has been referred for addiction treatment: [place], [place], MSW, LCSWA [phone] 08/06/2015, 2:01 PM

## 2015-08-06 NOTE — Progress Notes (Signed)
D/C instructions/transition record/suicide risk assessmenr/meds/follow-up appointments reviewed, pt verbalized understanding, pt's belongings returned to pt, denies SI/HI/AVH.

## 2015-08-07 LAB — PROLACTIN: Prolactin: 15.6 ng/mL — ABNORMAL HIGH (ref 4.0–15.2)

## 2016-06-06 ENCOUNTER — Ambulatory Visit (INDEPENDENT_AMBULATORY_CARE_PROVIDER_SITE_OTHER): Payer: Self-pay | Admitting: Vascular Surgery

## 2016-11-22 ENCOUNTER — Telehealth (INDEPENDENT_AMBULATORY_CARE_PROVIDER_SITE_OTHER): Payer: Self-pay | Admitting: Vascular Surgery

## 2016-11-22 ENCOUNTER — Other Ambulatory Visit (INDEPENDENT_AMBULATORY_CARE_PROVIDER_SITE_OTHER): Payer: Self-pay | Admitting: Vascular Surgery

## 2016-11-22 ENCOUNTER — Ambulatory Visit (INDEPENDENT_AMBULATORY_CARE_PROVIDER_SITE_OTHER): Payer: Medicare Other

## 2016-11-22 DIAGNOSIS — M79672 Pain in left foot: Secondary | ICD-10-CM

## 2016-11-22 DIAGNOSIS — M25475 Effusion, left foot: Secondary | ICD-10-CM

## 2016-11-22 NOTE — Telephone Encounter (Signed)
Called the patient to inform him to be here today for a 3 pm DVT study, to rule out DVT.

## 2016-11-22 NOTE — Telephone Encounter (Signed)
Patient called and thinks has a blood clot. Says top of left foot is sore and has a knot with a little bit of swelling. Wants to get in soon to be seen.

## 2016-11-22 NOTE — Telephone Encounter (Signed)
Please touch base with ultrasound. He will need a DVT study. He can just come in for the study and not be seen if that is faster. If we don't the time, he can seek medical attention at acute care or the ED

## 2016-11-24 ENCOUNTER — Telehealth (INDEPENDENT_AMBULATORY_CARE_PROVIDER_SITE_OTHER): Payer: Self-pay | Admitting: Vascular Surgery

## 2016-11-24 NOTE — Telephone Encounter (Signed)
PATIENT CALLED WANTING HIS RESULTS FROM 5/16 [place].

## 2016-11-27 NOTE — Telephone Encounter (Signed)
His results are not in the computer yet. You may need to ask the ultrasound tech who did the study.

## 2016-11-27 NOTE — Telephone Encounter (Signed)
Called the patient back to inform him of his ultrasound results, he was concerned since the ultrasound showed that he didn't have a DVT, but he is still having similar symptoms of still going on. We are looking to find a spot on the schedule for later on in the week so that the patient can come back in to the office to have a flow study done done rule out a flow problem. He states that he will wait on the phone call for that appointment and time.

## 2016-11-27 NOTE — Telephone Encounter (Signed)
CALLED PATIENT AND SCHEDULED HIM FOR 5/23 AT 3:30 AS INSTRUCTED BY CODY

## 2016-11-27 NOTE — Telephone Encounter (Signed)
He would need an ABI and a bilateral lower extremity venous to rule out reflux. He should wear medical grade one compression stockings and elevate his legs heart level or higher. He should come to the office and pick up a prescription for the stockings. This is not emergent as DVT has been ruled out.

## 2016-11-29 ENCOUNTER — Ambulatory Visit (INDEPENDENT_AMBULATORY_CARE_PROVIDER_SITE_OTHER): Payer: Medicare Other | Admitting: Vascular Surgery

## 2016-11-29 ENCOUNTER — Encounter (INDEPENDENT_AMBULATORY_CARE_PROVIDER_SITE_OTHER): Payer: Medicare Other

## 2019-03-05 ENCOUNTER — Other Ambulatory Visit (HOSPITAL_COMMUNITY): Payer: Self-pay | Admitting: Gastroenterology

## 2019-03-05 ENCOUNTER — Other Ambulatory Visit: Payer: Self-pay | Admitting: Gastroenterology

## 2019-03-05 DIAGNOSIS — R748 Abnormal levels of other serum enzymes: Secondary | ICD-10-CM

## 2019-03-05 DIAGNOSIS — R1010 Upper abdominal pain, unspecified: Secondary | ICD-10-CM

## 2019-03-10 ENCOUNTER — Other Ambulatory Visit: Payer: Self-pay

## 2019-03-10 ENCOUNTER — Ambulatory Visit
Admission: RE | Admit: 2019-03-10 | Discharge: 2019-03-10 | Disposition: A | Discharge status: Home / Self Care | Payer: Medicare Other | Source: Ambulatory Visit | Attending: Gastroenterology | Admitting: Gastroenterology

## 2019-03-10 ENCOUNTER — Encounter (INDEPENDENT_AMBULATORY_CARE_PROVIDER_SITE_OTHER): Payer: Self-pay

## 2019-03-10 DIAGNOSIS — R748 Abnormal levels of other serum enzymes: Secondary | ICD-10-CM | POA: Diagnosis present

## 2019-03-10 DIAGNOSIS — R1010 Upper abdominal pain, unspecified: Secondary | ICD-10-CM | POA: Insufficient documentation

## 2019-04-15 ENCOUNTER — Other Ambulatory Visit
Admission: RE | Admit: 2019-04-15 | Discharge: 2019-04-15 | Disposition: A | Discharge status: Home / Self Care | Payer: Medicare Other | Source: Ambulatory Visit | Attending: Gastroenterology | Admitting: Gastroenterology

## 2019-04-15 ENCOUNTER — Other Ambulatory Visit: Payer: Self-pay

## 2019-04-15 DIAGNOSIS — Z20828 Contact with and (suspected) exposure to other viral communicable diseases: Secondary | ICD-10-CM | POA: Insufficient documentation

## 2019-04-15 DIAGNOSIS — Z01812 Encounter for preprocedural laboratory examination: Secondary | ICD-10-CM | POA: Diagnosis present

## 2019-04-15 DIAGNOSIS — K648 Other hemorrhoids: Secondary | ICD-10-CM | POA: Diagnosis not present

## 2019-04-15 DIAGNOSIS — K5790 Diverticulosis of intestine, part unspecified, without perforation or abscess without bleeding: Secondary | ICD-10-CM | POA: Insufficient documentation

## 2019-04-15 DIAGNOSIS — K635 Polyp of colon: Secondary | ICD-10-CM | POA: Diagnosis not present

## 2019-04-15 DIAGNOSIS — Z8371 Family history of colonic polyps: Secondary | ICD-10-CM | POA: Diagnosis not present

## 2019-04-15 LAB — SARS CORONAVIRUS 2 (TAT 6-24 HRS): SARS Coronavirus 2: NEGATIVE

## 2019-04-17 ENCOUNTER — Encounter: Payer: Self-pay | Admitting: *Deleted

## 2019-04-18 ENCOUNTER — Encounter
Admission: RE | Disposition: A | Discharge status: Home / Self Care | Payer: Self-pay | Source: Ambulatory Visit | Attending: Gastroenterology

## 2019-04-18 ENCOUNTER — Ambulatory Visit: Payer: Medicare Other | Admitting: Anesthesiology

## 2019-04-18 ENCOUNTER — Other Ambulatory Visit: Payer: Self-pay

## 2019-04-18 ENCOUNTER — Ambulatory Visit
Admission: RE | Admit: 2019-04-18 | Discharge: 2019-04-18 | Disposition: A | Discharge status: Home / Self Care | Payer: Medicare Other | Source: Ambulatory Visit | Attending: Gastroenterology | Admitting: Gastroenterology

## 2019-04-18 DIAGNOSIS — D125 Benign neoplasm of sigmoid colon: Secondary | ICD-10-CM | POA: Insufficient documentation

## 2019-04-18 DIAGNOSIS — Z8371 Family history of colonic polyps: Secondary | ICD-10-CM | POA: Insufficient documentation

## 2019-04-18 DIAGNOSIS — I252 Old myocardial infarction: Secondary | ICD-10-CM | POA: Insufficient documentation

## 2019-04-18 DIAGNOSIS — D123 Benign neoplasm of transverse colon: Secondary | ICD-10-CM | POA: Diagnosis not present

## 2019-04-18 DIAGNOSIS — R0789 Other chest pain: Secondary | ICD-10-CM | POA: Insufficient documentation

## 2019-04-18 DIAGNOSIS — Z79899 Other long term (current) drug therapy: Secondary | ICD-10-CM | POA: Insufficient documentation

## 2019-04-18 DIAGNOSIS — Z7902 Long term (current) use of antithrombotics/antiplatelets: Secondary | ICD-10-CM | POA: Insufficient documentation

## 2019-04-18 DIAGNOSIS — K5730 Diverticulosis of large intestine without perforation or abscess without bleeding: Secondary | ICD-10-CM | POA: Insufficient documentation

## 2019-04-18 DIAGNOSIS — K3189 Other diseases of stomach and duodenum: Secondary | ICD-10-CM | POA: Diagnosis not present

## 2019-04-18 DIAGNOSIS — D120 Benign neoplasm of cecum: Secondary | ICD-10-CM | POA: Insufficient documentation

## 2019-04-18 DIAGNOSIS — Z1211 Encounter for screening for malignant neoplasm of colon: Secondary | ICD-10-CM | POA: Insufficient documentation

## 2019-04-18 DIAGNOSIS — K640 First degree hemorrhoids: Secondary | ICD-10-CM | POA: Diagnosis not present

## 2019-04-18 DIAGNOSIS — Z7982 Long term (current) use of aspirin: Secondary | ICD-10-CM | POA: Insufficient documentation

## 2019-04-18 DIAGNOSIS — G20 Parkinson's disease: Secondary | ICD-10-CM | POA: Insufficient documentation

## 2019-04-18 DIAGNOSIS — R1013 Epigastric pain: Secondary | ICD-10-CM | POA: Insufficient documentation

## 2019-04-18 DIAGNOSIS — I69398 Other sequelae of cerebral infarction: Secondary | ICD-10-CM | POA: Insufficient documentation

## 2019-04-18 DIAGNOSIS — F329 Major depressive disorder, single episode, unspecified: Secondary | ICD-10-CM | POA: Insufficient documentation

## 2019-04-18 DIAGNOSIS — Z87891 Personal history of nicotine dependence: Secondary | ICD-10-CM | POA: Diagnosis not present

## 2019-04-18 DIAGNOSIS — K317 Polyp of stomach and duodenum: Secondary | ICD-10-CM | POA: Insufficient documentation

## 2019-04-18 DIAGNOSIS — K219 Gastro-esophageal reflux disease without esophagitis: Secondary | ICD-10-CM | POA: Diagnosis not present

## 2019-04-18 DIAGNOSIS — H5462 Unqualified visual loss, left eye, normal vision right eye: Secondary | ICD-10-CM | POA: Diagnosis not present

## 2019-04-18 DIAGNOSIS — F0280 Dementia in other diseases classified elsewhere without behavioral disturbance: Secondary | ICD-10-CM | POA: Insufficient documentation

## 2019-04-18 HISTORY — PX: COLONOSCOPY WITH PROPOFOL: SHX5780

## 2019-04-18 HISTORY — DX: Personal history of urinary calculi: Z87.442

## 2019-04-18 HISTORY — PX: ESOPHAGOGASTRODUODENOSCOPY (EGD) WITH PROPOFOL: SHX5813

## 2019-04-18 HISTORY — DX: Gastro-esophageal reflux disease without esophagitis: K21.9

## 2019-04-18 HISTORY — DX: Acute myocardial infarction, unspecified: I21.9

## 2019-04-18 SURGERY — ESOPHAGOGASTRODUODENOSCOPY (EGD) WITH PROPOFOL
Anesthesia: General

## 2019-04-18 MED ORDER — FENTANYL CITRATE (PF) 100 MCG/2ML IJ SOLN
INTRAMUSCULAR | Status: DC | PRN
  Administered 2019-04-18 (×2): 25 ug via INTRAVENOUS
  Administered 2019-04-18: 50 ug via INTRAVENOUS

## 2019-04-18 MED ORDER — SODIUM CHLORIDE 0.9 % IV SOLN
INTRAVENOUS | Status: DC
  Administered 2019-04-18: 09:00:00 via INTRAVENOUS

## 2019-04-18 MED ORDER — PROPOFOL 500 MG/50ML IV EMUL
INTRAVENOUS | Status: AC
  Filled 2019-04-18: qty 50

## 2019-04-18 MED ORDER — LIDOCAINE HCL (PF) 2 % IJ SOLN
INTRAMUSCULAR | Status: DC | PRN
  Administered 2019-04-18: 80 mg

## 2019-04-18 MED ORDER — FENTANYL CITRATE (PF) 100 MCG/2ML IJ SOLN
INTRAMUSCULAR | Status: AC
  Filled 2019-04-18: qty 2

## 2019-04-18 MED ORDER — PROPOFOL 10 MG/ML IV BOLUS
INTRAVENOUS | Status: DC | PRN
  Administered 2019-04-18: 10 mg via INTRAVENOUS
  Administered 2019-04-18 (×2): 20 mg via INTRAVENOUS

## 2019-04-18 MED ORDER — EPHEDRINE SULFATE 50 MG/ML IJ SOLN
INTRAMUSCULAR | Status: DC | PRN
  Administered 2019-04-18: 5 mg via INTRAVENOUS
  Administered 2019-04-18 (×3): 10 mg via INTRAVENOUS
  Administered 2019-04-18: 15 mg via INTRAVENOUS

## 2019-04-18 MED ORDER — LIDOCAINE HCL (PF) 2 % IJ SOLN
INTRAMUSCULAR | Status: AC
  Filled 2019-04-18: qty 10

## 2019-04-18 MED ORDER — MIDAZOLAM HCL 5 MG/5ML IJ SOLN
INTRAMUSCULAR | Status: DC | PRN
  Administered 2019-04-18: 2 mg via INTRAVENOUS

## 2019-04-18 MED ORDER — PROPOFOL 500 MG/50ML IV EMUL
INTRAVENOUS | Status: DC | PRN
  Administered 2019-04-18: 50 ug/kg/min via INTRAVENOUS

## 2019-04-18 MED ORDER — MIDAZOLAM HCL 2 MG/2ML IJ SOLN
INTRAMUSCULAR | Status: AC
  Filled 2019-04-18: qty 2

## 2019-04-18 NOTE — Anesthesia Preprocedure Evaluation (Signed)
Anesthesia Evaluation  Patient identified by MRN, date of birth, ID band Patient awake    Reviewed: Allergy & Precautions, H&P , NPO status , Patient's Chart, lab work & pertinent test results, reviewed documented beta blocker date and time   History of Anesthesia Complications Negative for: history of anesthetic complications  Airway Mallampati: I  TM Distance: >3 FB Neck ROM: full    Dental  (+) Dental Advidsory Given, Teeth Intact   Pulmonary neg pulmonary ROS, former smoker,    Pulmonary exam normal        Cardiovascular Exercise Tolerance: Good (-) hypertension(-) angina+ Past MI  (-) Cardiac Stents and (-) CABG Normal cardiovascular exam(-) dysrhythmias (-) Valvular Problems/Murmurs     Neuro/Psych neg Seizures PSYCHIATRIC DISORDERS Depression Dementia 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), Residual Symptoms    GI/Hepatic Neg liver ROS, GERD  ,  Endo/Other  negative endocrine ROS  Renal/GU negative Renal ROS  negative genitourinary   Musculoskeletal   Abdominal   Peds  Hematology negative hematology ROS (+)   Anesthesia Other Findings Past Medical History: No date: Dementia (HCC) No date: Dementia (HCC) No date: GERD (gastroesophageal reflux disease) No date: History of kidney stones No date: Myocardial infarction ([place]) No date: Parkinson disease ([place]) No date: TIA (transient ischemic attack)   Reproductive/Obstetrics negative OB ROS                             Anesthesia Physical Anesthesia Plan  ASA: III  Anesthesia Plan: General   Post-op Pain Management:    Induction: Intravenous  PONV Risk Score and Plan: 2 and Propofol infusion and TIVA  Airway Management Planned: Natural Airway and Nasal Cannula  Additional Equipment:   Intra-op Plan:   Post-operative Plan:   Informed Consent: I have reviewed the patients History and Physical, chart, labs and discussed the procedure  including the risks, benefits and alternatives for the proposed anesthesia with the patient or authorized representative who has indicated his/her understanding and acceptance.     Dental Advisory Given  Plan Discussed with: Anesthesiologist, CRNA and Surgeon  Anesthesia Plan Comments:         Anesthesia Quick Evaluation

## 2019-04-18 NOTE — Op Note (Signed)
[facility] Gastroenterology Patient Name: Darren Wang Procedure Date: 04/18/2019 8:37 AM MRN: [id number] Account #: [id number] Date of Birth: January 25, 1951 Admit Type: Outpatient Age: 68 Room: [facility] ENDO ROOM 3 Gender: Male Note Status: Finalized Procedure:            Upper GI endoscopy Indications:          Upper abdominal pain, Unexplained chest pain Providers:            Lollie Sails, MD Medicines:            Monitored Anesthesia Care Complications:        No immediate complications. Procedure:            Pre-Anesthesia Assessment:                       - ASA Grade Assessment: III - A patient with severe                        systemic disease.                       After obtaining informed consent, the endoscope was                        passed under direct vision. Throughout the procedure,                        the patient's blood pressure, pulse, and oxygen                        saturations were monitored continuously. The Endoscope                        was introduced through the mouth, and advanced to the                        third part of duodenum. The upper GI endoscopy was                        accomplished without difficulty. The patient tolerated                        the procedure well. Findings:      The Z-line was variable. Biopsies were taken with a cold forceps for       histology.      The exam of the esophagus was otherwise normal.      A single 4 mm sessile polyp with no bleeding and no stigmata of recent       bleeding was found on the anterior wall of the gastric body. Biopsies       were taken with a cold forceps for histology.      The exam of the stomach was otherwise normal.      Biopsies were taken with a cold forceps in the gastric body and in the       gastric antrum for histology.      The cardia and gastric fundus were normal on retroflexion.      The examined duodenum was normal. Impression:           - Z-line  variable. Biopsied.                       -  A single gastric polyp. Biopsied.                       - Normal examined duodenum.                       - Biopsies were taken with a cold forceps for histology                        in the gastric body and in the gastric antrum. Recommendation:       - Continue present medications.                       - Return to GI clinic in 4 weeks.                       - Await pathology results. Procedure Code(s):    --- Professional ---                       [phone], Esophagogastroduodenoscopy, flexible, transoral;                        with biopsy, single or multiple Diagnosis Code(s):    --- Professional ---                       K22.8, Other specified diseases of esophagus                       K31.7, Polyp of stomach and duodenum                       R10.10, Upper abdominal pain, unspecified                       R07.9, Chest pain, unspecified CPT copyright 2019 American Medical Association. All rights reserved. The codes documented in this report are preliminary and upon coder review may  be revised to meet current compliance requirements. Lollie Sails, MD 04/18/2019 9:06:12 AM This report has been signed electronically. Number of Addenda: 0 Note Initiated On: 04/18/2019 8:37 AM      [facility]

## 2019-04-18 NOTE — H&P (Signed)
Outpatient short stay form Pre-procedure 04/18/2019 8:44 AM Darren Sails MD  Primary Physician: Dr. Thereasa Distance  Reason for visit: EGD and colonoscopy  History of present illness: Patient is a 68 year old male presenting today for EGD and colonoscopy in regards to complaint of dyspepsia and atypical chest pain as well as family history of colon polyps in a primary relative, mother.  Patient's last colonoscopy was actually in March 2012 no polyps at that time.  Tolerated his prep well.  Patient patient does take Plavix but is held that for this will be the 6-day.  Aches no other aspirin product or blood thinning agent.    Current Facility-Administered Medications:  .  0.9 %  sodium chloride infusion, , Intravenous, Continuous, Darren Sails, MD, Last Rate: 20 mL/hr at 04/18/19 [id number]  Medications Prior to Admission  Medication Sig Dispense Refill Last Dose  . aspirin EC 81 MG tablet Take 81 mg by mouth daily.   04/12/2019  . busPIRone (BUSPAR) 5 MG tablet Take 1 tablet (5 mg total) by mouth 2 (two) times daily. 60 tablet 0 04/17/2019 at Unknown time  . carbidopa-levodopa (SINEMET CR) 50-200 MG tablet Take 1 tablet by mouth 2 (two) times daily.   04/17/2019 at Unknown time  . clonazePAM (KLONOPIN) 1 MG tablet Take 1 mg by mouth at bedtime.   04/17/2019 at Unknown time  . donepezil (ARICEPT) 10 MG tablet Take 10 mg by mouth daily before breakfast.   04/17/2019 at Unknown time  . escitalopram (LEXAPRO) 20 MG tablet Take 20 mg by mouth daily.   04/17/2019 at Unknown time  . memantine (NAMENDA) 10 MG tablet Take 10 mg by mouth 2 (two) times daily.   04/17/2019 at Unknown time  . Multiple Vitamin (MULTIVITAMIN) capsule Take 1 capsule by mouth daily.   Past Week at Unknown time  . QUEtiapine (SEROQUEL) 25 MG tablet Take 25 mg by mouth daily. At 1700   04/17/2019 at Unknown time  . vitamin B-12 (CYANOCOBALAMIN) 1000 MCG tablet Take 1,000 mcg by mouth daily.   Past Week at Unknown time  .  clopidogrel (PLAVIX) 75 MG tablet Take 75 mg by mouth daily.   04/10/2019  . pantoprazole (PROTONIX) 40 MG tablet Take 40 mg by mouth daily.        Allergies  Allergen Reactions  . Benadryl [Diphenhydramine]   . Neosporin [Bacitracin-Polymyxin B]      Past Medical History:  Diagnosis Date  . Dementia ([place])   . Dementia ([place])   . GERD (gastroesophageal reflux disease)   . History of kidney stones   . Myocardial infarction ([place])   . Parkinson disease ([place])   . TIA (transient ischemic attack)     Review of systems:      Physical Exam    Heart and lungs: Regular rate and rhythm without rub or gallop lungs are bilaterally clear    HEENT: Normocephalic atraumatic eyes are anicteric    Other:    Pertinant exam for procedure: Soft nontender nondistended bowel sounds positive normoactive    Planned proceedures: EGD, colonoscopy and indicated procedures. I have discussed the risks benefits and complications of procedures to include not limited to bleeding, infection, perforation and the risk of sedation and the patient wishes to proceed.    Darren Sails, MD Gastroenterology 04/18/2019  8:44 AM

## 2019-04-18 NOTE — Transfer of Care (Signed)
Immediate Anesthesia Transfer of Care Note  Patient: Darren Wang  Procedure(s) Performed: ESOPHAGOGASTRODUODENOSCOPY (EGD) WITH PROPOFOL (N/A ) COLONOSCOPY WITH PROPOFOL (N/A )  Patient Location: PACU  Anesthesia Type:General  Level of Consciousness: sedated  Airway & Oxygen Therapy: Patient Spontanous Breathing and Patient connected to nasal cannula oxygen  Post-op Assessment: Report given to RN and Post -op Vital signs reviewed and stable  Post vital signs: Reviewed and stable  Last Vitals:  Vitals Value Taken Time  BP    Temp    Pulse 54 04/18/19 0935  Resp 10 04/18/19 0935  SpO2 100 % 04/18/19 0935  Vitals shown include unvalidated device data.  Last Pain:  Vitals:   04/18/19 0829  TempSrc: Oral  PainSc: 0-No pain         Complications: No apparent anesthesia complications

## 2019-04-18 NOTE — Anesthesia Postprocedure Evaluation (Signed)
Anesthesia Post Note  Patient: Darren Wang  Procedure(s) Performed: ESOPHAGOGASTRODUODENOSCOPY (EGD) WITH PROPOFOL (N/A ) COLONOSCOPY WITH PROPOFOL (N/A )  Patient location during evaluation: Endoscopy Anesthesia Type: General Level of consciousness: awake and alert Pain management: pain level controlled Vital Signs Assessment: post-procedure vital signs reviewed and stable Respiratory status: spontaneous breathing, nonlabored ventilation, respiratory function stable and patient connected to nasal cannula oxygen Cardiovascular status: blood pressure returned to baseline and stable Postop Assessment: no apparent nausea or vomiting Anesthetic complications: no     Last Vitals:  Vitals:   04/18/19 1005 04/18/19 1015  BP: 109/60 123/74  Pulse: (!) 52 (!) 50  Resp: 11 17  Temp:    SpO2: 100% 95%    Last Pain:  Vitals:   04/18/19 0955  TempSrc:   PainSc: 0-No pain                 Martha Clan

## 2019-04-18 NOTE — Anesthesia Post-op Follow-up Note (Signed)
Anesthesia QCDR form completed.        

## 2019-04-18 NOTE — Op Note (Signed)
[facility] Gastroenterology Patient Name: Darren Wang Procedure Date: 04/18/2019 8:37 AM MRN: [id number] Account #: [id number] Date of Birth: 1950/11/09 Admit Type: Outpatient Age: 68 Room: [facility] ENDO ROOM 3 Gender: Male Note Status: Finalized Procedure:            Colonoscopy Indications:          Family history of colonic polyps in a first-degree                        relative Providers:            Lollie Sails, MD Medicines:            Monitored Anesthesia Care Complications:        No immediate complications. Procedure:            Pre-Anesthesia Assessment:                       - ASA Grade Assessment: III - A patient with severe                        systemic disease.                       After obtaining informed consent, the colonoscope was                        passed under direct vision. Throughout the procedure,                        the patient's blood pressure, pulse, and oxygen                        saturations were monitored continuously. The                        Colonoscope was introduced through the anus and                        advanced to the the cecum, identified by appendiceal                        orifice and ileocecal valve. The colonoscopy was                        performed without difficulty. The patient tolerated the                        procedure well. The quality of the bowel preparation                        was good. Findings:      Multiple small-mouthed diverticula were found in the sigmoid colon and       descending colon.      A 4 mm polyp was found in the cecum. The polyp was sessile. The polyp       was removed with a cold biopsy forceps. Resection and retrieval were       complete.      A 4 mm polyp was found in the proximal transverse colon. The polyp was       sessile. The polyp was removed with a  cold biopsy forceps. Resection and       retrieval were complete.      A 4 mm polyp was found in the  proximal sigmoid colon. The polyp was       sessile. The polyp was removed with a cold biopsy forceps. Resection and       retrieval were complete.      Non-bleeding internal hemorrhoids were found during retroflexion and       during anoscopy, note small anal pillar. The hemorrhoids were small and       Grade I (internal hemorrhoids that do not prolapse).      No additional abnormalities were found on retroflexion. Impression:           - Diverticulosis in the sigmoid colon and in the                        descending colon.                       - One 4 mm polyp in the cecum, removed with a cold                        biopsy forceps. Resected and retrieved.                       - One 4 mm polyp in the proximal transverse colon,                        removed with a cold biopsy forceps. Resected and                        retrieved.                       - One 4 mm polyp in the proximal sigmoid colon, removed                        with a cold biopsy forceps. Resected and retrieved.                       - Non-bleeding internal hemorrhoids. Recommendation:       - Await pathology results.                       - Telephone GI clinic for pathology results in 5 days. Procedure Code(s):    --- Professional ---                       [phone], Colonoscopy, flexible; with biopsy, single or                        multiple Diagnosis Code(s):    --- Professional ---                       K64.0, First degree hemorrhoids                       K63.5, Polyp of colon                       Z83.71, Family history of colonic polyps  K57.30, Diverticulosis of large intestine without                        perforation or abscess without bleeding CPT copyright 2019 American Medical Association. All rights reserved. The codes documented in this report are preliminary and upon coder review may  be revised to meet current compliance requirements. Lollie Sails, MD 04/18/2019 9:36:18  AM This report has been signed electronically. Number of Addenda: 0 Note Initiated On: 04/18/2019 8:37 AM Total Procedure Duration: 0 hours 14 minutes 52 seconds       [facility]

## 2019-04-21 ENCOUNTER — Encounter: Payer: Self-pay | Admitting: Gastroenterology

## 2019-04-21 LAB — SURGICAL PATHOLOGY

## 2020-02-26 ENCOUNTER — Other Ambulatory Visit: Payer: Self-pay

## 2020-02-26 ENCOUNTER — Encounter: Payer: Self-pay | Admitting: Emergency Medicine

## 2020-02-26 ENCOUNTER — Inpatient Hospital Stay: Payer: Medicare Other

## 2020-02-26 ENCOUNTER — Inpatient Hospital Stay
Admission: EM | Admit: 2020-02-26 | Discharge: 2020-03-02 | DRG: 177 | Disposition: A | Discharge status: Home Health | Payer: Medicare Other | Attending: Family Medicine | Admitting: Family Medicine

## 2020-02-26 ENCOUNTER — Emergency Department: Payer: Medicare Other

## 2020-02-26 DIAGNOSIS — I361 Nonrheumatic tricuspid (valve) insufficiency: Secondary | ICD-10-CM | POA: Diagnosis not present

## 2020-02-26 DIAGNOSIS — N179 Acute kidney failure, unspecified: Secondary | ICD-10-CM | POA: Diagnosis not present

## 2020-02-26 DIAGNOSIS — G20 Parkinson's disease: Secondary | ICD-10-CM | POA: Diagnosis present

## 2020-02-26 DIAGNOSIS — R0902 Hypoxemia: Secondary | ICD-10-CM | POA: Diagnosis present

## 2020-02-26 DIAGNOSIS — Z7902 Long term (current) use of antithrombotics/antiplatelets: Secondary | ICD-10-CM

## 2020-02-26 DIAGNOSIS — F329 Major depressive disorder, single episode, unspecified: Secondary | ICD-10-CM | POA: Diagnosis present

## 2020-02-26 DIAGNOSIS — I2699 Other pulmonary embolism without acute cor pulmonale: Secondary | ICD-10-CM | POA: Diagnosis present

## 2020-02-26 DIAGNOSIS — Z6824 Body mass index (BMI) 24.0-24.9, adult: Secondary | ICD-10-CM | POA: Diagnosis not present

## 2020-02-26 DIAGNOSIS — Z79899 Other long term (current) drug therapy: Secondary | ICD-10-CM | POA: Diagnosis not present

## 2020-02-26 DIAGNOSIS — Z8673 Personal history of transient ischemic attack (TIA), and cerebral infarction without residual deficits: Secondary | ICD-10-CM

## 2020-02-26 DIAGNOSIS — K219 Gastro-esophageal reflux disease without esophagitis: Secondary | ICD-10-CM | POA: Diagnosis present

## 2020-02-26 DIAGNOSIS — I252 Old myocardial infarction: Secondary | ICD-10-CM

## 2020-02-26 DIAGNOSIS — Z7982 Long term (current) use of aspirin: Secondary | ICD-10-CM | POA: Diagnosis not present

## 2020-02-26 DIAGNOSIS — Z9049 Acquired absence of other specified parts of digestive tract: Secondary | ICD-10-CM

## 2020-02-26 DIAGNOSIS — J208 Acute bronchitis due to other specified organisms: Secondary | ICD-10-CM | POA: Diagnosis present

## 2020-02-26 DIAGNOSIS — F0280 Dementia in other diseases classified elsewhere without behavioral disturbance: Secondary | ICD-10-CM | POA: Diagnosis present

## 2020-02-26 DIAGNOSIS — G3183 Dementia with Lewy bodies: Secondary | ICD-10-CM | POA: Diagnosis present

## 2020-02-26 DIAGNOSIS — J1282 Pneumonia due to coronavirus disease 2019: Secondary | ICD-10-CM | POA: Diagnosis present

## 2020-02-26 DIAGNOSIS — Z87442 Personal history of urinary calculi: Secondary | ICD-10-CM | POA: Diagnosis not present

## 2020-02-26 DIAGNOSIS — Z888 Allergy status to other drugs, medicaments and biological substances status: Secondary | ICD-10-CM | POA: Diagnosis not present

## 2020-02-26 DIAGNOSIS — I2510 Atherosclerotic heart disease of native coronary artery without angina pectoris: Secondary | ICD-10-CM | POA: Diagnosis present

## 2020-02-26 DIAGNOSIS — U071 COVID-19: Principal | ICD-10-CM | POA: Diagnosis present

## 2020-02-26 DIAGNOSIS — R630 Anorexia: Secondary | ICD-10-CM | POA: Diagnosis present

## 2020-02-26 DIAGNOSIS — R0602 Shortness of breath: Secondary | ICD-10-CM

## 2020-02-26 DIAGNOSIS — F0390 Unspecified dementia without behavioral disturbance: Secondary | ICD-10-CM | POA: Diagnosis present

## 2020-02-26 DIAGNOSIS — Z87891 Personal history of nicotine dependence: Secondary | ICD-10-CM

## 2020-02-26 DIAGNOSIS — R0789 Other chest pain: Secondary | ICD-10-CM

## 2020-02-26 LAB — PROTIME-INR
INR: 1.2 (ref 0.8–1.2)
Prothrombin Time: 14.3 seconds (ref 11.4–15.2)

## 2020-02-26 LAB — COMPREHENSIVE METABOLIC PANEL
ALT: 11 U/L (ref 0–44)
AST: 22 U/L (ref 15–41)
Albumin: 2.8 g/dL — ABNORMAL LOW (ref 3.5–5.0)
Alkaline Phosphatase: 50 U/L (ref 38–126)
Anion gap: 9 (ref 5–15)
BUN: 14 mg/dL (ref 8–23)
CO2: 23 mmol/L (ref 22–32)
Calcium: 8.2 mg/dL — ABNORMAL LOW (ref 8.9–10.3)
Chloride: 106 mmol/L (ref 98–111)
Creatinine, Ser: 0.91 mg/dL (ref 0.61–1.24)
GFR calc Af Amer: >60 mL/min (ref >60)
GFR calc non Af Amer: >60 mL/min (ref >60)
Glucose, Bld: 111 mg/dL — ABNORMAL HIGH (ref 70–99)
Potassium: 4.4 mmol/L (ref 3.5–5.1)
Sodium: 138 mmol/L (ref 135–145)
Total Bilirubin: 0.9 mg/dL (ref 0.3–1.2)
Total Protein: 6.5 g/dL (ref 6.5–8.1)

## 2020-02-26 LAB — CBC WITH DIFFERENTIAL/PLATELET
Abs Immature Granulocytes: 0.12 10*3/uL — ABNORMAL HIGH (ref 0.00–0.07)
Basophils Absolute: 0 10*3/uL (ref 0.0–0.1)
Basophils Relative: 0 %
Eosinophils Absolute: 0 10*3/uL (ref 0.0–0.5)
Eosinophils Relative: 0 %
HCT: 31.9 % — ABNORMAL LOW (ref 39.0–52.0)
Hemoglobin: 11 g/dL — ABNORMAL LOW (ref 13.0–17.0)
Immature Granulocytes: 1 %
Lymphocytes Relative: 6 %
Lymphs Abs: 0.6 10*3/uL — ABNORMAL LOW (ref 0.7–4.0)
MCH: 30.6 pg (ref 26.0–34.0)
MCHC: 34.5 g/dL (ref 30.0–36.0)
MCV: 88.9 fL (ref 80.0–100.0)
Monocytes Absolute: 0.7 10*3/uL (ref 0.1–1.0)
Monocytes Relative: 6 %
Neutro Abs: 10.1 10*3/uL — ABNORMAL HIGH (ref 1.7–7.7)
Neutrophils Relative %: 87 %
Platelets: 287 10*3/uL (ref 150–400)
RBC: 3.59 MIL/uL — ABNORMAL LOW (ref 4.22–5.81)
RDW: 14 % (ref 11.5–15.5)
WBC: 11.6 10*3/uL — ABNORMAL HIGH (ref 4.0–10.5)
nRBC: 0 % (ref 0.0–0.2)

## 2020-02-26 LAB — HIV ANTIBODY (ROUTINE TESTING W REFLEX): HIV Screen 4th Generation wRfx: NONREACTIVE

## 2020-02-26 LAB — BRAIN NATRIURETIC PEPTIDE: B Natriuretic Peptide: 214.3 pg/mL — ABNORMAL HIGH (ref 0.0–100.0)

## 2020-02-26 LAB — ABO/RH: ABO/RH(D): A POS

## 2020-02-26 LAB — APTT: aPTT: 29 seconds (ref 24–36)

## 2020-02-26 LAB — LACTIC ACID, PLASMA: Lactic Acid, Venous: 0.9 mmol/L (ref 0.5–1.9)

## 2020-02-26 LAB — TROPONIN I (HIGH SENSITIVITY): Troponin I (High Sensitivity): 5 ng/L (ref <18)

## 2020-02-26 MED ORDER — PANTOPRAZOLE SODIUM 40 MG PO TBEC
40.0000 mg | DELAYED_RELEASE_TABLET | Freq: Two times a day (BID) | ORAL | Status: DC
  Administered 2020-02-26 – 2020-03-02 (×10): 40 mg via ORAL
  Filled 2020-02-26 (×10): qty 1

## 2020-02-26 MED ORDER — ADULT MULTIVITAMIN W/MINERALS CH
1.0000 | ORAL_TABLET | Freq: Every day | ORAL | Status: DC
  Administered 2020-02-27 – 2020-03-02 (×5): 1 via ORAL
  Filled 2020-02-26 (×5): qty 1

## 2020-02-26 MED ORDER — ACETAMINOPHEN 325 MG PO TABS: 650.0000 mg | ORAL_TABLET | Freq: Four times a day (QID) | ORAL | Status: DC | PRN

## 2020-02-26 MED ORDER — IOHEXOL 350 MG/ML SOLN
75.0000 mL | Freq: Once | INTRAVENOUS | Status: AC | PRN
  Administered 2020-02-26: 75 mL via INTRAVENOUS
  Filled 2020-02-26: qty 75

## 2020-02-26 MED ORDER — SERTRALINE HCL 50 MG PO TABS
100.0000 mg | ORAL_TABLET | Freq: Two times a day (BID) | ORAL | Status: DC
  Administered 2020-02-26 – 2020-03-02 (×10): 100 mg via ORAL
  Filled 2020-02-26 (×10): qty 2

## 2020-02-26 MED ORDER — ASCORBIC ACID 500 MG PO TABS
500.0000 mg | ORAL_TABLET | Freq: Every day | ORAL | Status: DC
  Administered 2020-02-27 – 2020-03-02 (×5): 500 mg via ORAL
  Filled 2020-02-26 (×4): qty 1

## 2020-02-26 MED ORDER — ZINC SULFATE 220 (50 ZN) MG PO CAPS
220.0000 mg | ORAL_CAPSULE | Freq: Every day | ORAL | Status: DC
  Administered 2020-02-27 – 2020-03-02 (×5): 220 mg via ORAL
  Filled 2020-02-26 (×4): qty 1

## 2020-02-26 MED ORDER — SODIUM CHLORIDE 0.9% FLUSH: 3.0000 mL | INTRAVENOUS | Status: DC | PRN

## 2020-02-26 MED ORDER — LORATADINE 10 MG PO TABS
10.0000 mg | ORAL_TABLET | Freq: Every day | ORAL | Status: DC
  Administered 2020-02-27 – 2020-03-02 (×5): 10 mg via ORAL
  Filled 2020-02-26 (×6): qty 1

## 2020-02-26 MED ORDER — ACETAMINOPHEN 500 MG PO TABS: 500.0000 mg | ORAL_TABLET | Freq: Four times a day (QID) | ORAL | Status: DC | PRN

## 2020-02-26 MED ORDER — ATORVASTATIN CALCIUM 20 MG PO TABS
10.0000 mg | ORAL_TABLET | Freq: Every day | ORAL | Status: DC
  Administered 2020-02-27 – 2020-03-02 (×5): 10 mg via ORAL
  Filled 2020-02-26 (×5): qty 1

## 2020-02-26 MED ORDER — DEXAMETHASONE SODIUM PHOSPHATE 10 MG/ML IJ SOLN
6.0000 mg | INTRAMUSCULAR | Status: DC
  Administered 2020-02-27 – 2020-03-02 (×5): 6 mg via INTRAVENOUS
  Filled 2020-02-26 (×5): qty 1

## 2020-02-26 MED ORDER — FUROSEMIDE 10 MG/ML IJ SOLN
40.0000 mg | Freq: Every day | INTRAMUSCULAR | Status: DC
  Administered 2020-02-27: 40 mg via INTRAVENOUS
  Filled 2020-02-26: qty 4

## 2020-02-26 MED ORDER — QUETIAPINE FUMARATE 25 MG PO TABS
25.0000 mg | ORAL_TABLET | Freq: Every day | ORAL | Status: DC
  Administered 2020-02-27 – 2020-03-01 (×4): 25 mg via ORAL
  Filled 2020-02-26 (×4): qty 1

## 2020-02-26 MED ORDER — SODIUM CHLORIDE 0.9 % IV SOLN
200.0000 mg | Freq: Once | INTRAVENOUS | Status: AC
  Administered 2020-02-26: 200 mg via INTRAVENOUS
  Filled 2020-02-26: qty 200

## 2020-02-26 MED ORDER — HEPARIN (PORCINE) 25000 UT/250ML-% IV SOLN
1500.0000 [IU]/h | INTRAVENOUS | Status: DC
  Administered 2020-02-26: 1150 [IU]/h via INTRAVENOUS
  Administered 2020-02-27 (×2): 1350 [IU]/h via INTRAVENOUS
  Administered 2020-02-27: 20:00:00 1400 [IU]/h via INTRAVENOUS
  Administered 2020-02-28: 1500 [IU]/h via INTRAVENOUS
  Administered 2020-02-28: 1550 [IU]/h via INTRAVENOUS
  Filled 2020-02-26 (×4): qty 250

## 2020-02-26 MED ORDER — MEMANTINE HCL 5 MG PO TABS
10.0000 mg | ORAL_TABLET | Freq: Every day | ORAL | Status: DC
  Administered 2020-02-27 – 2020-03-02 (×5): 10 mg via ORAL
  Filled 2020-02-26 (×5): qty 2

## 2020-02-26 MED ORDER — AZITHROMYCIN 500 MG PO TABS
500.0000 mg | ORAL_TABLET | Freq: Every day | ORAL | Status: DC
  Administered 2020-02-26 – 2020-03-01 (×5): 500 mg via ORAL
  Filled 2020-02-26 (×5): qty 1

## 2020-02-26 MED ORDER — ENOXAPARIN SODIUM 40 MG/0.4ML ~~LOC~~ SOLN: 40.0000 mg | SUBCUTANEOUS | Status: DC

## 2020-02-26 MED ORDER — DEXAMETHASONE SODIUM PHOSPHATE 10 MG/ML IJ SOLN
6.0000 mg | Freq: Once | INTRAMUSCULAR | Status: AC
  Filled 2020-02-26: qty 1

## 2020-02-26 MED ORDER — LACTATED RINGERS IV BOLUS
1000.0000 mL | Freq: Once | INTRAVENOUS | Status: AC
  Administered 2020-02-26: 1000 mL via INTRAVENOUS

## 2020-02-26 MED ORDER — OMEGA-3-ACID ETHYL ESTERS 1 G PO CAPS
1.0000 | ORAL_CAPSULE | Freq: Every day | ORAL | Status: DC
  Administered 2020-02-27 – 2020-03-02 (×5): 1 g via ORAL
  Filled 2020-02-26 (×5): qty 1

## 2020-02-26 MED ORDER — ASPIRIN EC 81 MG PO TBEC
81.0000 mg | DELAYED_RELEASE_TABLET | Freq: Every day | ORAL | Status: DC
  Administered 2020-02-27 – 2020-03-02 (×5): 81 mg via ORAL
  Filled 2020-02-26 (×5): qty 1

## 2020-02-26 MED ORDER — ESCITALOPRAM OXALATE 10 MG PO TABS
20.0000 mg | ORAL_TABLET | Freq: Every day | ORAL | Status: DC
  Administered 2020-02-27 – 2020-03-02 (×5): 20 mg via ORAL
  Filled 2020-02-26 (×6): qty 2

## 2020-02-26 MED ORDER — CARBIDOPA-LEVODOPA ER 25-100 MG PO TBCR
1.0000 | EXTENDED_RELEASE_TABLET | Freq: Two times a day (BID) | ORAL | Status: DC
  Administered 2020-02-27 – 2020-03-02 (×10): 1 via ORAL
  Filled 2020-02-26 (×13): qty 1

## 2020-02-26 MED ORDER — VITAMIN B-12 1000 MCG PO TABS
1000.0000 ug | ORAL_TABLET | Freq: Every day | ORAL | Status: DC
  Administered 2020-02-27 – 2020-03-02 (×5): 1000 ug via ORAL
  Filled 2020-02-26 (×5): qty 1

## 2020-02-26 MED ORDER — MULTIVITAMINS PO CAPS: 1.0000 | ORAL_CAPSULE | Freq: Every day | ORAL | Status: DC

## 2020-02-26 MED ORDER — CLONAZEPAM 1 MG PO TABS: 1.0000 mg | ORAL_TABLET | Freq: Every day | ORAL | Status: DC | PRN

## 2020-02-26 MED ORDER — HYDROCOD POLST-CPM POLST ER 10-8 MG/5ML PO SUER: 5.0000 mL | Freq: Two times a day (BID) | ORAL | Status: DC | PRN

## 2020-02-26 MED ORDER — SODIUM CHLORIDE 0.9 % IV SOLN: 250.0000 mL | INTRAVENOUS | Status: DC | PRN

## 2020-02-26 MED ORDER — ONDANSETRON HCL 4 MG/2ML IJ SOLN: 4.0000 mg | Freq: Four times a day (QID) | INTRAMUSCULAR | Status: DC | PRN

## 2020-02-26 MED ORDER — SODIUM CHLORIDE 0.9 % IV SOLN
100.0000 mg | Freq: Every day | INTRAVENOUS | Status: AC
  Administered 2020-02-27 – 2020-03-01 (×4): 100 mg via INTRAVENOUS
  Filled 2020-02-26: qty 100
  Filled 2020-02-26 (×4): qty 20

## 2020-02-26 MED ORDER — ALBUTEROL SULFATE HFA 108 (90 BASE) MCG/ACT IN AERS
2.0000 | INHALATION_SPRAY | RESPIRATORY_TRACT | Status: DC | PRN
  Filled 2020-02-26: qty 6.7

## 2020-02-26 MED ORDER — ONDANSETRON HCL 4 MG PO TABS: 4.0000 mg | ORAL_TABLET | Freq: Four times a day (QID) | ORAL | Status: DC | PRN

## 2020-02-26 MED ORDER — DONEPEZIL HCL 5 MG PO TABS
10.0000 mg | ORAL_TABLET | Freq: Every day | ORAL | Status: DC
  Administered 2020-02-27 – 2020-03-02 (×5): 10 mg via ORAL
  Filled 2020-02-26 (×5): qty 2

## 2020-02-26 MED ORDER — HEPARIN BOLUS VIA INFUSION
3600.0000 [IU] | Freq: Once | INTRAVENOUS | Status: AC
  Administered 2020-02-26: 3600 [IU] via INTRAVENOUS
  Filled 2020-02-26: qty 3600

## 2020-02-26 MED ORDER — BUSPIRONE HCL 15 MG PO TABS
30.0000 mg | ORAL_TABLET | Freq: Every day | ORAL | Status: DC
  Administered 2020-02-26 – 2020-03-01 (×5): 30 mg via ORAL
  Filled 2020-02-26: qty 6
  Filled 2020-02-26 (×5): qty 2

## 2020-02-26 MED ORDER — DEXAMETHASONE SODIUM PHOSPHATE 10 MG/ML IJ SOLN
INTRAMUSCULAR | Status: AC
  Administered 2020-02-26: 6 mg via INTRAVENOUS
  Filled 2020-02-26: qty 1

## 2020-02-26 MED ORDER — SODIUM CHLORIDE 0.9% FLUSH
3.0000 mL | Freq: Two times a day (BID) | INTRAVENOUS | Status: DC
  Administered 2020-02-26 – 2020-03-02 (×10): 3 mL via INTRAVENOUS

## 2020-02-26 NOTE — ED Provider Notes (Addendum)
[facility] Emergency Department Provider Note ____________________________________________   First MD Initiated Contact with Patient 02/26/20 1112     (approximate)  I have reviewed the triage vital signs and the nursing notes.  HISTORY  Chief Complaint Chest Pain   HPI Darren Wang is a 69 y.o. malewho presents to the ED for evaluation of shortness of breath.   Chart review indicates hx Parkinson's, dementia.  PCP through [place] [facility].  Patient was seen as an outpatient this morning by nurse practitioner.  Review of her note indicates patient was diagnosed 13 days ago with COVID-19.  Is already finished a course of Augmentin, prednisone taper and continues to use an albuterol inhaler with some transient improvement.  Noted to be tachypneic and tachycardic in the clinic, and so transfer to the ED for further evaluation.  Patient told NP that he does not want to be mechanically ventilated.  Here in the ED, patient reports confirmation of the above documentation from NP.  He reports "that I just cannot catch my breath."  Patient reporting dyspnea, nonproductive cough with associated substernal chest pressure that is constant.  Reports 3/10 intensity.  Reports minimally productive cough that has been persistent without changing.  Reports generalized weakness and anorexia.  Denies productive cough, radiating chest pain, syncope.  Denies vaccination for Covid    Past Medical History:  Diagnosis Date  . Dementia ([place])   . Dementia ([place])   . GERD (gastroesophageal reflux disease)   . History of kidney stones   . Myocardial infarction ([place])   . Parkinson disease ([place])   . TIA (transient ischemic attack)     Patient Active Problem List   Diagnosis Date Noted  . Pneumonia due to COVID-19 virus 02/26/2020  . Parkinson disease ([place]) 08/04/2015  . Suicidal ideation 08/04/2015  . Major depressive disorder, recurrent severe without psychotic features ([place])  08/04/2015  . Lewy body dementia ([place]) 08/04/2015    Past Surgical History:  Procedure Laterality Date  . CHOLECYSTECTOMY    . COLONOSCOPY WITH ESOPHAGOGASTRODUODENOSCOPY (EGD) N/A   . COLONOSCOPY WITH PROPOFOL N/A 04/18/2019   Procedure: COLONOSCOPY WITH PROPOFOL;  Surgeon: Lollie Sails, MD;  Location: [facility] ENDOSCOPY;  Service: Endoscopy;  Laterality: N/A;  . ESOPHAGOGASTRODUODENOSCOPY (EGD) WITH PROPOFOL N/A 04/18/2019   Procedure: ESOPHAGOGASTRODUODENOSCOPY (EGD) WITH PROPOFOL;  Surgeon: Lollie Sails, MD;  Location: [facility] ENDOSCOPY;  Service: Endoscopy;  Laterality: N/A;  . 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 Authorizing Provider  aspirin EC 81 MG tablet Take 81 mg by mouth daily.    [provider]  busPIRone (BUSPAR) 5 MG tablet Take 1 tablet (5 mg total) by mouth 2 (two) times daily. 08/06/15   Pucilowska, Jolanta B, MD  carbidopa-levodopa (SINEMET CR) 50-200 MG tablet Take 1 tablet by mouth 2 (two) times daily.    [provider]  clonazePAM (KLONOPIN) 1 MG tablet Take 1 mg by mouth at bedtime.    [provider]  clopidogrel (PLAVIX) 75 MG tablet Take 75 mg by mouth daily.    [provider]  donepezil (ARICEPT) 10 MG tablet Take 10 mg by mouth daily before breakfast.    [provider]  escitalopram (LEXAPRO) 20 MG tablet Take 20 mg by mouth daily.    [provider]  memantine (NAMENDA) 10 MG tablet Take 10 mg by mouth 2 (two) times daily.  [provider]  Multiple Vitamin (MULTIVITAMIN) capsule Take 1 capsule by mouth daily.    [provider]  pantoprazole (PROTONIX) 40 MG tablet Take 40 mg by mouth daily.    [provider]  QUEtiapine (SEROQUEL) 25 MG tablet Take 25 mg by mouth daily. At 1700    [provider]  vitamin B-12 (CYANOCOBALAMIN) 1000 MCG tablet Take 1,000 mcg by mouth daily.     [provider]    Allergies Benadryl [diphenhydramine] and Neosporin [bacitracin-polymyxin b]  History reviewed. No pertinent family history.  Social History Social History   Tobacco Use  . Smoking status: Former Research scientist (life sciences)  . Smokeless tobacco: Never Used  Vaping Use  . Vaping Use: Never used  Substance Use Topics  . Alcohol use: No  . Drug use: Never    Review of Systems  Constitutional: Positive for fevers and chills.  Positive generalized weakness. Eyes: No visual changes. ENT: No sore throat. Cardiovascular: Positive for chest pain. Respiratory: Positive shortness of breath. Gastrointestinal: No abdominal pain.  No nausea, no vomiting.  No diarrhea.  No constipation. Genitourinary: Negative for dysuria. Musculoskeletal: Negative for back pain. Skin: Negative for rash. Neurological: Negative for headaches, focal weakness or numbness.   ____________________________________________   PHYSICAL EXAM:  VITAL SIGNS: Vitals:   02/26/20 1135  BP: 96/64  Pulse: 66  Resp: 17  Temp: 98 F (36.7 C)  SpO2: 97%      Constitutional: Alert and oriented.  Sitting up in bed with nasal cannula, dyspneic and tachypneic to the 30s.. Eyes: Conjunctivae are normal. PERRL. EOMI. Head: Atraumatic. Nose: No congestion/rhinnorhea. Mouth/Throat: Mucous membranes are mo.  Oropharynx non-erythematous. Neck: No stridor. No cervical spine tenderness to palpation. Cardiovascular: Normal rate, regular rhythm. Grossly normal heart sounds.  Good peripheral circulation. Respiratory: Tachypneic to the mid 20s/low 30s. no retractions. Lungs CTAB. Gastrointestinal: Soft , nondistended, nontender to palpation. No abdominal bruits. No CVA tenderness. Musculoskeletal: No lower extremity tenderness nor edema.  No joint effusions. No signs of acute trauma. Neurologic:  Normal speech and language. No gross focal neurologic deficits are appreciated. No gait instability noted. Skin:  Skin is  warm, dry and intact. No rash noted. Psychiatric: Mood and affect are normal. Speech and behavior are normal.  ____________________________________________   LABS (all labs ordered are listed, but only abnormal results are displayed)  Labs Reviewed  CBC WITH DIFFERENTIAL/PLATELET - Abnormal; Notable for the following components:      Result Value   WBC 11.6 (*)    RBC 3.59 (*)    Hemoglobin 11.0 (*)    HCT 31.9 (*)    Neutro Abs 10.1 (*)    Lymphs Abs 0.6 (*)    Abs Immature Granulocytes 0.12 (*)    All other components within normal limits  COMPREHENSIVE METABOLIC PANEL - Abnormal; Notable for the following components:   Glucose, Bld 111 (*)    Calcium 8.2 (*)    Albumin 2.8 (*)    All other components within normal limits  BRAIN NATRIURETIC PEPTIDE - Abnormal; Notable for the following components:   B Natriuretic Peptide 214.3 (*)    All other components within normal limits  CULTURE, BLOOD (ROUTINE X 2)  CULTURE, BLOOD (ROUTINE X 2)  LACTIC ACID, PLASMA  TROPONIN I (HIGH SENSITIVITY)   ____________________________________________  12 Lead EKG  Twelve-lead EKG shows a sinus rhythm, rate of 71 bpm, normal axis and intervals.  No evidence of acute ischemia. ____________________________________________  RADIOLOGY  ED MD interpretation: CXR  with diffuse multifocal patchy infiltrates consistent with COVID-19 without discrete lobar infiltration.  Questionable left upper lobe external object or possible mass.  Official radiology report(s): DG Chest Portable 1 View  Result Date: 02/26/2020 CLINICAL DATA:  Short of breath.  Coronavirus infection. EXAM: PORTABLE CHEST 1 VIEW COMPARISON:  09/08/2010 FINDINGS: Heart and mediastinal shadows are normal. Widespread hazy and patchy pulmonary infiltrates consistent with viral pneumonia. No dense consolidation, collapse or effusion. Density of the left upper chest favored to represent artifactual density. If not, a left upper lobe mass  could be present. IMPRESSION: Widespread hazy and patchy pulmonary infiltrates consistent with viral pneumonia. Question focal density in the left upper lobe. This could be artifactual or could represent focal infiltrate or a mass lesion. Electronically Signed   By: Nelson Chimes M.D.   On: 02/26/2020 11:41    ____________________________________________   PROCEDURES and INTERVENTIONS  Procedure(s) performed (including Critical Care):  Procedures  Medications  dexamethasone (DECADRON) injection 6 mg (has no administration in time range)  lactated ringers bolus 1,000 mL (0 mLs Intravenous Stopped 02/26/20 1300)    ____________________________________________   INITIAL IMPRESSION / ASSESSMENT AND PLAN / ED COURSE  69 year old unvaccinated male presenting 2 weeks into Covid course with worsening symptoms, found to be hypoxic, and required medical admission.  Patient hypoxic to the mid 80s on room air, otherwise normal vital signs.  Exam with dyspnea and tachypnea, but with clear lungs and no evidence of additional acute pathology.  Blood work unremarkable.  CXR with expected infiltrates, but also possible LUL mass versus infiltrate.  Due to patient not having a productive cough, leukocytosis, lactic acidosis or further evidence of bacterial superimposed pneumonia, will withhold antibiotics at this time discussed the case with hospitalist.  Will admit to hospitalist medicine for further work-up and management of his hypoxic respiratory illness.  Clinical Course as of Feb 25 1329  Thu Feb 26, 2020  1253 RN informs me of desaturations to the mid 80s with ambulation.  We will admit.  Hospitalist paged.   [DS]  [id number] Spoke with Dr. Francine Graven, admitting hospitalist, who requests CTA chest to evaluate PE and LUL mass on CXR. I order this   [DS]    Clinical Course User Index [DS] Vladimir Crofts, MD     ____________________________________________   FINAL CLINICAL IMPRESSION(S) / ED  DIAGNOSES  Final diagnoses:  COVID-19  Shortness of breath  Hypoxia  Other chest pain     ED Discharge Orders    None       Renn Dirocco Tamala Julian   Note:  This document was prepared using Dragon voice recognition software and may include unintentional dictation errors.   Vladimir Crofts, MD 02/26/20 1300    Vladimir Crofts, MD 02/26/20 1330

## 2020-02-26 NOTE — H&P (Addendum)
History and Physical    Darren Wang GNF:[id number] DOB: 08-29-1950 DOA: 02/26/2020  PCP: Sofie Hartigan, MD   Patient coming from: Home  I have personally briefly reviewed patient's old medical records in [place]  Chief Complaint: Shortness of breath  HPI: Darren Wang is a 69 y.o. male with medical history significant for parkinson's disease and Lewy body dementia who was referred to the ER by his PCP for evaluation of shortness of breath. Patient was diagnosed with COVID 19 viral infection about 2 weeks ago (he tested positive on 02/13/20) and was treated with a course of Augmentin, Prednisone taper as well as bronchodilator therapy. Patient's symptoms initially improved but over the last couple of days he developed worsening shortness of breath with minimal exertion. He also complains of chest pain which is mid sternal and rated 4/10 in intensity at its worst. Pain is non radiating.  He also has cough that is productive of greenish phlegm as well as intermittent low grade fever. He continues to have anorexia related to COVID as well as generalized weakness. He denies having any nausea, vomiting, diaphoresis or palpitations. He denies loss of consciousness, dizziness or light headedness. Labs show normal electrolytes, BNP 214, WBC 11.6 with a left shift Chest x ray reviewed by me showed widespread hazy and patchy pulmonary infiltrates consistent with patient's known history of COVID 19 infection. ?? Density in the left upper lobe CTA showed extensive bilateral pulmonary emboli. Marked severity diffuse bilateral predominately ground-glass appearing infiltrates. Small bilateral pleural effusions. 12 Lead EKG shows normal sinus rhythm with LVH Patient noted to have room air pulse oximetry of 88 - 90% on room air at rest and was placed on 2L of oxygen with improvement in his pulse oximetry to 97%    ED Course: Patient is a 69 year old male who was sent to the ER from his PCP  for evaluation of worsening shortness of breath. He was diagnosed with COVID pneumonia almost 2 weeks ago and treated with systemic steroids, antibiotics and bronchodilator therapy. He was sent to the ER for evaluation of exertional shortness of breath and chest pain and noted to have bilateral PE as well as findings suggestive of COVID pneumonia. He will be admitted to the hospital for further evaluation.  Review of Systems: As per HPI otherwise 10 point review of systems negative.    Past Medical History:  Diagnosis Date  . Dementia ([place])   . Dementia ([place])   . GERD (gastroesophageal reflux disease)   . History of kidney stones   . Myocardial infarction ([place])   . Parkinson disease ([place])   . TIA (transient ischemic attack)     Past Surgical History:  Procedure Laterality Date  . CHOLECYSTECTOMY    . COLONOSCOPY WITH ESOPHAGOGASTRODUODENOSCOPY (EGD) N/A   . COLONOSCOPY WITH PROPOFOL N/A 04/18/2019   Procedure: COLONOSCOPY WITH PROPOFOL;  Surgeon: Lollie Sails, MD;  Location: [facility] ENDOSCOPY;  Service: Endoscopy;  Laterality: N/A;  . ESOPHAGOGASTRODUODENOSCOPY (EGD) WITH PROPOFOL N/A 04/18/2019   Procedure: ESOPHAGOGASTRODUODENOSCOPY (EGD) WITH PROPOFOL;  Surgeon: Lollie Sails, MD;  Location: [facility] ENDOSCOPY;  Service: Endoscopy;  Laterality: N/A;  . 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 He has never used smokeless tobacco. He reports that he does not drink alcohol and does not use drugs.  Allergies  Allergen Reactions  . Benadryl [Diphenhydramine]   . Neosporin [Bacitracin-Polymyxin B]  History reviewed. No pertinent family history.   Prior to Admission medications   Medication Sig Start Date End Date Taking? Authorizing Provider  acetaminophen (TYLENOL) 500 MG tablet Take by mouth.   Yes [provider]  albuterol (VENTOLIN HFA) 108 (90 Base) MCG/ACT inhaler INHALE 2 INHALATIONS INTO THE LUNGS EVERY 4 HOURS AS NEEDED  FOR WHEEZING OR SHORTNESS OF BREATH 02/13/20  Yes [provider]  amoxicillin-clavulanate (AUGMENTIN) 875-125 MG tablet SMARTSIG:1 Tablet(s) By Mouth Every 12 Hours 02/13/20  Yes [provider]  aspirin EC 81 MG tablet Take 81 mg by mouth every other day.    Yes [provider]  atorvastatin (LIPITOR) 10 MG tablet Take 10 mg by mouth daily. 02/09/20  Yes [provider]  busPIRone (BUSPAR) 30 MG tablet Take 15 mg by mouth in the morning and at bedtime.    Yes [provider]  Carbidopa-Levodopa ER (SINEMET CR) 25-100 MG tablet controlled release Take 1 tablet by mouth 2 (two) times daily.   Yes [provider]  clonazePAM (KLONOPIN) 1 MG tablet Take 1 mg by mouth at bedtime as needed.    Yes [provider]  clopidogrel (PLAVIX) 75 MG tablet Take 75 mg by mouth daily.   Yes [provider]  donepezil (ARICEPT) 10 MG tablet Take 10 mg by mouth daily before breakfast.   Yes [provider]  fexofenadine (ALLEGRA) 180 MG tablet Take by mouth. 05/08/12  Yes [provider]  memantine (NAMENDA) 10 MG tablet Take 10 mg by mouth daily.    Yes [provider]  Multiple Vitamin (MULTIVITAMIN) capsule Take 1 capsule by mouth daily.   Yes [provider]  Omega 3 1000 MG CAPS Take 1 capsule by mouth daily. 02/28/12  Yes [provider]  pantoprazole (PROTONIX) 40 MG tablet Take 40 mg by mouth 2 (two) times daily.    Yes [provider]  predniSONE (DELTASONE) 10 MG tablet Take 10 mg by mouth daily with breakfast.  02/19/20  Yes [provider]  QUEtiapine (SEROQUEL) 25 MG tablet Take 50 mg by mouth at bedtime. At 1700    Yes [provider]  sertraline (ZOLOFT) 100 MG tablet Take 200 mg by mouth daily.  02/06/20  Yes [provider]  vitamin B-12 (CYANOCOBALAMIN) 1000 MCG tablet Take 1,000 mcg by mouth daily.   Yes [provider]    Physical  Exam: Vitals:   02/26/20 1200 02/26/20 1230 02/26/20 1345 02/26/20 1347  BP: 110/73 111/73 112/72 112/72  Pulse: 62 63 61 63  Resp: (!) 24 (!) 25  20  Temp:      SpO2: 97% 91% 94% 94%  Weight:      Height:         Vitals:   02/26/20 1200 02/26/20 1230 02/26/20 1345 02/26/20 1347  BP: 110/73 111/73 112/72 112/72  Pulse: 62 63 61 63  Resp: (!) 24 (!) 25  20  Temp:      SpO2: 97% 91% 94% 94%  Weight:      Height:        Constitutional: NAD, alert and oriented x 3 Eyes: PERRL, lids and conjunctivae normal ENMT: Mucous membranes are moist.  Neck: normal, supple, no masses, no thyromegaly Respiratory:  no wheezing,  Crackles at the bases, Normal respiratory effort. No accessory muscle use.  Cardiovascular: Regular rate and rhythm, no murmurs / rubs / gallops. No extremity edema. 2+ pedal pulses. No carotid bruits.  Abdomen: no tenderness,  no masses palpated. No hepatosplenomegaly. Bowel sounds positive.  Musculoskeletal: no clubbing / cyanosis. No joint deformity upper and lower extremities.  Skin: no rashes, lesions, ulcers.  Neurologic: No gross focal neurologic deficit. Psychiatric: Normal mood and affect.   Labs on Admission: I have personally reviewed following labs and imaging studies  CBC: Recent Labs  Lab 02/26/20 1050  WBC 11.6*  NEUTROABS 10.1*  HGB 11.0*  HCT 31.9*  MCV 88.9  PLT 009   Basic Metabolic Panel: Recent Labs  Lab 02/26/20 1050  NA 138  K 4.4  CL 106  CO2 23  GLUCOSE 111*  BUN 14  CREATININE 0.91  CALCIUM 8.2*   GFR: Estimated Creatinine Clearance: 76.2 mL/min (by C-G formula based on SCr of 0.91 mg/dL). Liver Function Tests: Recent Labs  Lab 02/26/20 1050  AST 22  ALT 11  ALKPHOS 50  BILITOT 0.9  PROT 6.5  ALBUMIN 2.8*   No results for input(s): LIPASE, AMYLASE in the last 168 hours. No results for input(s): AMMONIA in the last 168 hours. Coagulation Profile: Recent Labs  Lab 02/26/20 1815  INR 1.2   Cardiac  Enzymes: No results for input(s): CKTOTAL, CKMB, CKMBINDEX, TROPONINI in the last 168 hours. BNP (last 3 results) No results for input(s): PROBNP in the last 8760 hours. HbA1C: No results for input(s): HGBA1C in the last 72 hours. CBG: No results for input(s): GLUCAP in the last 168 hours. Lipid Profile: No results for input(s): CHOL, HDL, LDLCALC, TRIG, CHOLHDL, LDLDIRECT in the last 72 hours. Thyroid Function Tests: No results for input(s): TSH, T4TOTAL, FREET4, T3FREE, THYROIDAB in the last 72 hours. Anemia Panel: No results for input(s): VITAMINB12, FOLATE, FERRITIN, TIBC, IRON, RETICCTPCT in the last 72 hours. Urine analysis:    Component Value Date/Time   COLORURINE STRAW (A) 08/04/2015 1510   APPEARANCEUR CLEAR (A) 08/04/2015 1510   LABSPEC 1.004 (L) 08/04/2015 1510   PHURINE 7.0 08/04/2015 1510   GLUCOSEU NEGATIVE 08/04/2015 1510   HGBUR NEGATIVE 08/04/2015 1510   BILIRUBINUR NEGATIVE 08/04/2015 1510   KETONESUR NEGATIVE 08/04/2015 1510   PROTEINUR NEGATIVE 08/04/2015 1510   NITRITE NEGATIVE 08/04/2015 1510   LEUKOCYTESUR NEGATIVE 08/04/2015 1510    Radiological Exams on Admission: CT Angio Chest PE W and/or Wo Contrast  Result Date: 02/26/2020 CLINICAL DATA:  Hypoxia with left upper lobe mass on chest x-ray. EXAM: CT ANGIOGRAPHY CHEST WITH CONTRAST TECHNIQUE: Multidetector CT imaging of the chest was performed using the standard protocol during bolus administration of intravenous contrast. Multiplanar CT image reconstructions and MIPs were obtained to evaluate the vascular anatomy. CONTRAST:  36mL OMNIPAQUE IOHEXOL 350 MG/ML SOLN COMPARISON:  September 21, 2010 FINDINGS: Cardiovascular: Extensive intraluminal filling defects are seen throughout numerous branches of the bilateral pulmonary arteries. Involvement of the distal portions of the main pulmonary arteries is also seen. There is no evidence of saddle embolus or heart strain. The heart is normal in size. No pericardial  effusion is identified. Moderate severity coronary artery calcification is noted. Mediastinum/Nodes: No enlarged mediastinal, hilar, or axillary lymph nodes. Thyroid gland, trachea, and esophagus demonstrate no significant findings. Lungs/Pleura: Marked severity diffuse predominately ground-glass appearing infiltrates are seen throughout both lungs. There are small bilateral pleural effusions. No pneumothorax is identified. Upper Abdomen: Multiple surgical clips are seen within the gallbladder fossa. Musculoskeletal: Chronic compression fracture deformities are seen within the mid thoracic spine. Review of the MIP images confirms the above findings. IMPRESSION: 1. Extensive bilateral pulmonary emboli. 2. Marked severity diffuse bilateral predominately ground-glass  appearing infiltrates. 3. Small bilateral pleural effusions. Electronically Signed   By: Virgina Norfolk M.D.   On: 02/26/2020 17:04   DG Chest Portable 1 View  Result Date: 02/26/2020 CLINICAL DATA:  Short of breath.  Coronavirus infection. EXAM: PORTABLE CHEST 1 VIEW COMPARISON:  09/08/2010 FINDINGS: Heart and mediastinal shadows are normal. Widespread hazy and patchy pulmonary infiltrates consistent with viral pneumonia. No dense consolidation, collapse or effusion. Density of the left upper chest favored to represent artifactual density. If not, a left upper lobe mass could be present. IMPRESSION: Widespread hazy and patchy pulmonary infiltrates consistent with viral pneumonia. Question focal density in the left upper lobe. This could be artifactual or could represent focal infiltrate or a mass lesion. Electronically Signed   By: Nelson Chimes M.D.   On: 02/26/2020 11:41    EKG: Independently reviewed.  Sinus Rhythm  Assessment/Plan Principal Problem:   Pulmonary embolism (HCC) Active Problems:   Parkinson disease (HCC)   Lewy body dementia ([place])   Pneumonia due to COVID-19 virus   Dementia ([place])    Acute PE  Related to underlying  COVID 19 infection Patient presented for evaluation of worsening shortness of breath with hypoxia and CTA showed bilateral PE with no right heart strain Start patient on heparin drip Obtain 2D Echocardiogram to assess LVEF   Pneumonia due to COVID 19 Patient tested positive for COVID 19 viral infection on 02/13/20 He was treated as an outpatient with a steroid taper, antibiotics and bronchodilator Will start patient on Remdesivir per protocol Start patient on Decadron 6mg  IV daily Continue oxygen supplementation to maintain pulse oximetry > 88%   Dementia and Depression Lewy body dementia related to his known history of Parkinson's disease Continue Buspirone, Namenda, Lexapro, Zoloft, Aricept and Seroquel   CAD Continue aspirin, statins and Fish oil   Acute Bronchitis Will treat with Zithromax 500mg  daily x 5 days  Parkinson's Disease Continue Sinemet   DVT prophylaxis: Heparin Code Status: Full code Family Communication: Greater than 50% of time was spent discussing plan of care with patient at the bedside. He verbalizes understanding and agrees with the plan Disposition Plan: Back to previous home environment Consults called: None    Tyren Dugar MD [facility]     02/26/2020, 7:01 PM

## 2020-02-26 NOTE — Consult Note (Addendum)
Remdesivir - Pharmacy Brief Note   O:  CXR: Widespread hazy and patchy pulmonary infiltrates consistent with viral PNA.  SpO2: 97% on [place]  Per chart review/notes, he was diagnosed with COVID 19 viral infection about 2 weeks ago (he tested positive on 02/13/20)   A/P:  Remdesivir 200 mg IVPB once followed by 100 mg IVPB daily x 4 days.   Kristeen Miss, PharmD Clinical Pharmacist 02/26/2020 4:32 PM

## 2020-02-26 NOTE — Consult Note (Signed)
ANTICOAGULATION CONSULT NOTE  Pharmacy Consult for Heparin  Indication: Pulmonary Embolism  Allergies  Allergen Reactions  . Benadryl [Diphenhydramine]   . Neosporin [Bacitracin-Polymyxin B]     Patient Measurements: Height: 5\' 8"  (172.7 cm) Weight: 72.6 kg (160 lb) IBW/kg (Calculated) : 68.4 Heparin Dosing Weight: 72.6 kg   Vital Signs: Temp: 98 F (36.7 C) (08/19 1135) BP: 112/72 (08/19 1347) Pulse Rate: 63 (08/19 1347)  Labs: Recent Labs    02/26/20 1050 02/26/20 1815  HGB 11.0*  --   HCT 31.9*  --   PLT 287  --   APTT  --  29  LABPROT  --  14.3  INR  --  1.2  CREATININE 0.91  --   TROPONINIHS 5  --     Estimated Creatinine Clearance: 76.2 mL/min (by C-G formula based on SCr of 0.91 mg/dL).   Medications:  Confirmed no PTA anticoagulants   Assessment: Pharmacy consulted for heparin dosing in a patient who was diagnosed with COVID 19 ~ 2 weeks ago and is currently being treated for the virus. No baseline d-dimers presently.    Goal of Therapy:  Heparin level 0.3-0.7 units/ml Monitor platelets by anticoagulation protocol: Yes   Plan:  Baseline labs have been ordered  Heparin DW: 72.6 kg  Give 3600 units bolus x 1 Start heparin infusion at 1150 units/hr Check anti-Xa level in 6 hours  (from the time heparin is started) and daily while on heparin, per protocol Continue to monitor H&H and platelets   Cherisse Carrell R Gearldine Looney 02/26/2020,7:29 PM

## 2020-02-26 NOTE — ED Triage Notes (Signed)
Pt presents from home via [facility] with c/o chest pain and shortness of breath for past 2 days. Pt recently diagnosed with covid on 8/9. Pt room air saturation 88-90%. On 2L pt currently satting 97%. Pt c/o dyspnea with exertion. Pt respirations currently even and unlabored at this time. PT alert and oriented x4.

## 2020-02-26 NOTE — ED Notes (Signed)
Pt oxygen saturation 87% on room air with patient ambulation. Pt quickly recuperated to 93% on room air when placed back in bed. MD Tamala Julian aware.

## 2020-02-26 NOTE — ED Notes (Signed)
Pt transported to CT ?

## 2020-02-27 DIAGNOSIS — U071 COVID-19: Principal | ICD-10-CM

## 2020-02-27 DIAGNOSIS — J1282 Pneumonia due to coronavirus disease 2019: Secondary | ICD-10-CM

## 2020-02-27 DIAGNOSIS — R0602 Shortness of breath: Secondary | ICD-10-CM

## 2020-02-27 LAB — HEPARIN LEVEL (UNFRACTIONATED)
Heparin Unfractionated: 0.16 IU/mL — ABNORMAL LOW (ref 0.30–0.70)
Heparin Unfractionated: 0.24 IU/mL — ABNORMAL LOW (ref 0.30–0.70)
Heparin Unfractionated: 0.31 IU/mL (ref 0.30–0.70)

## 2020-02-27 LAB — CBC WITH DIFFERENTIAL/PLATELET
Abs Immature Granulocytes: 0.13 10*3/uL — ABNORMAL HIGH (ref 0.00–0.07)
Basophils Absolute: 0 10*3/uL (ref 0.0–0.1)
Basophils Relative: 0 %
Eosinophils Absolute: 0 10*3/uL (ref 0.0–0.5)
Eosinophils Relative: 0 %
HCT: 32.2 % — ABNORMAL LOW (ref 39.0–52.0)
Hemoglobin: 10.9 g/dL — ABNORMAL LOW (ref 13.0–17.0)
Immature Granulocytes: 1 %
Lymphocytes Relative: 5 %
Lymphs Abs: 0.7 10*3/uL (ref 0.7–4.0)
MCH: 30 pg (ref 26.0–34.0)
MCHC: 33.9 g/dL (ref 30.0–36.0)
MCV: 88.7 fL (ref 80.0–100.0)
Monocytes Absolute: 0.8 10*3/uL (ref 0.1–1.0)
Monocytes Relative: 5 %
Neutro Abs: 13.6 10*3/uL — ABNORMAL HIGH (ref 1.7–7.7)
Neutrophils Relative %: 89 %
Platelets: 307 10*3/uL (ref 150–400)
RBC: 3.63 MIL/uL — ABNORMAL LOW (ref 4.22–5.81)
RDW: 13.6 % (ref 11.5–15.5)
WBC: 15.2 10*3/uL — ABNORMAL HIGH (ref 4.0–10.5)
nRBC: 0 % (ref 0.0–0.2)

## 2020-02-27 LAB — COMPREHENSIVE METABOLIC PANEL
ALT: 11 U/L (ref 0–44)
AST: 25 U/L (ref 15–41)
Albumin: 2.8 g/dL — ABNORMAL LOW (ref 3.5–5.0)
Alkaline Phosphatase: 52 U/L (ref 38–126)
Anion gap: 12 (ref 5–15)
BUN: 20 mg/dL (ref 8–23)
CO2: 21 mmol/L — ABNORMAL LOW (ref 22–32)
Calcium: 8.3 mg/dL — ABNORMAL LOW (ref 8.9–10.3)
Chloride: 105 mmol/L (ref 98–111)
Creatinine, Ser: 0.82 mg/dL (ref 0.61–1.24)
GFR calc Af Amer: >60 mL/min (ref >60)
GFR calc non Af Amer: >60 mL/min (ref >60)
Glucose, Bld: 115 mg/dL — ABNORMAL HIGH (ref 70–99)
Potassium: 3.7 mmol/L (ref 3.5–5.1)
Sodium: 138 mmol/L (ref 135–145)
Total Bilirubin: 0.7 mg/dL (ref 0.3–1.2)
Total Protein: 6.6 g/dL (ref 6.5–8.1)

## 2020-02-27 LAB — C-REACTIVE PROTEIN: CRP: 11.3 mg/dL — ABNORMAL HIGH (ref <1.0)

## 2020-02-27 LAB — FIBRIN DERIVATIVES D-DIMER (ARMC ONLY): Fibrin derivatives D-dimer (ARMC): >7500 ng/mL (FEU) — ABNORMAL HIGH (ref 0.00–499.00)

## 2020-02-27 LAB — MAGNESIUM: Magnesium: 2 mg/dL (ref 1.7–2.4)

## 2020-02-27 LAB — PHOSPHORUS: Phosphorus: 2.5 mg/dL (ref 2.5–4.6)

## 2020-02-27 LAB — FERRITIN: Ferritin: 532 ng/mL — ABNORMAL HIGH (ref 24–336)

## 2020-02-27 MED ORDER — HEPARIN BOLUS VIA INFUSION
2000.0000 [IU] | Freq: Once | INTRAVENOUS | Status: AC
  Administered 2020-02-27: 2000 [IU] via INTRAVENOUS
  Filled 2020-02-27: qty 2000

## 2020-02-27 MED ORDER — HEPARIN BOLUS VIA INFUSION: 1000.0000 [IU] | Freq: Once | INTRAVENOUS | Status: DC

## 2020-02-27 NOTE — Plan of Care (Signed)
Will continue to gather information and monitor the patient.

## 2020-02-27 NOTE — ED Notes (Signed)
Pt sitting on side of bed  Pt remains on 2 L/o2   O2 sats range from 94%-95%  Denies any complaints  Has spoken with family on phone

## 2020-02-27 NOTE — Consult Note (Signed)
ANTICOAGULATION CONSULT NOTE  Pharmacy Consult for Heparin  Indication: Pulmonary Embolism  Allergies  Allergen Reactions  . Benadryl [Diphenhydramine]   . Neosporin [Bacitracin-Polymyxin B]     Patient Measurements: Height: 5\' 8"  (172.7 cm) Weight: 72.6 kg (160 lb) IBW/kg (Calculated) : 68.4 Heparin Dosing Weight: 72.6 kg   Vital Signs: Temp: 97.3 F (36.3 C) (08/20 1741) Temp Source: Oral (08/20 1741) BP: 115/85 (08/20 1741) Pulse Rate: 63 (08/20 1741)  Labs: Recent Labs    02/26/20 1050 02/26/20 1815 02/27/20 0443 02/27/20 1250 02/27/20 1749  HGB 11.0*  --   --  10.9*  --   HCT 31.9*  --   --  32.2*  --   PLT 287  --   --  307  --   APTT  --  29  --   --   --   LABPROT  --  14.3  --   --   --   INR  --  1.2  --   --   --   HEPARINUNFRC  --   --  0.16* 0.24* 0.31  CREATININE 0.91  --   --  0.82  --   TROPONINIHS 5  --   --   --   --     Estimated Creatinine Clearance: 84.6 mL/min (by C-G formula based on SCr of 0.82 mg/dL).   Medications:  Confirmed no PTA anticoagulants   Assessment: Pharmacy consulted for heparin dosing in a patient who was diagnosed with COVID 19 ~ 2 weeks ago and is currently being treated for the virus. No baseline d-dimers presently.   Date and time Level Plan  8/19 2139 n/a Initial rate 1150 units/hr  8/20 0443 0.16, subtherapeutic Rate  increased to 1350 units/hr  8/20 1250 0.24, subtherapeutic No change, drip interrupted    Goal of Therapy:  Heparin level 0.3-0.7 units/ml Monitor platelets by anticoagulation protocol: Yes   Plan:  --0820 at 1749 HL = 0.31, just therapeutic.Will slightly adjust rate to 1400 units/hr --Check anti-Xa level  In 6 hrs --Continue to monitor H&H and platelets   Jevaun Strick A 02/27/2020,7:06 PM

## 2020-02-27 NOTE — ED Notes (Signed)
Pt has been up to [facility] and to use urinal   Denies any complaints at present

## 2020-02-27 NOTE — Progress Notes (Signed)
Flow Coordinator from receiving unit called to request report while floor RN discharging patient. Unable to contact ED nurse at this time.

## 2020-02-27 NOTE — Consult Note (Signed)
ANTICOAGULATION CONSULT NOTE  Pharmacy Consult for Heparin  Indication: Pulmonary Embolism  Allergies  Allergen Reactions  . Benadryl [Diphenhydramine]   . Neosporin [Bacitracin-Polymyxin B]     Patient Measurements: Height: 5\' 8"  (172.7 cm) Weight: 72.6 kg (160 lb) IBW/kg (Calculated) : 68.4 Heparin Dosing Weight: 72.6 kg   Vital Signs: Temp: 97.9 F (36.6 C) (08/19 2122) BP: 124/77 (08/20 0400) Pulse Rate: 54 (08/20 0400)  Labs: Recent Labs    02/26/20 1050 02/26/20 1815 02/27/20 0443  HGB 11.0*  --   --   HCT 31.9*  --   --   PLT 287  --   --   APTT  --  29  --   LABPROT  --  14.3  --   INR  --  1.2  --   HEPARINUNFRC  --   --  0.16*  CREATININE 0.91  --   --   TROPONINIHS 5  --   --     Estimated Creatinine Clearance: 76.2 mL/min (by C-G formula based on SCr of 0.91 mg/dL).   Medications:  Confirmed no PTA anticoagulants   Assessment: Pharmacy consulted for heparin dosing in a patient who was diagnosed with COVID 19 ~ 2 weeks ago and is currently being treated for the virus. No baseline d-dimers presently.   0820 0443 HL 0.16, subtherapeutic   Goal of Therapy:  Heparin level 0.3-0.7 units/ml Monitor platelets by anticoagulation protocol: Yes   Plan:  HL subtherapeutic, will give heparin bolus 2000 units x 1 and increase heparin infusion to 1350 units/hr Check anti-Xa level in 6 hours Continue to monitor H&H and platelets   [place] Crane, Nysia Dell A 02/27/2020,5:49 AM

## 2020-02-27 NOTE — Consult Note (Signed)
ANTICOAGULATION CONSULT NOTE  Pharmacy Consult for Heparin  Indication: Pulmonary Embolism  Allergies  Allergen Reactions  . Benadryl [Diphenhydramine]   . Neosporin [Bacitracin-Polymyxin B]     Patient Measurements: Height: 5\' 8"  (172.7 cm) Weight: 72.6 kg (160 lb) IBW/kg (Calculated) : 68.4 Heparin Dosing Weight: 72.6 kg   Vital Signs: Temp: 98 F (36.7 C) (08/20 0758) BP: 120/70 (08/20 1143) Pulse Rate: 70 (08/20 1143)  Labs: Recent Labs    02/26/20 1050 02/26/20 1815 02/27/20 0443 02/27/20 1250  HGB 11.0*  --   --  10.9*  HCT 31.9*  --   --  32.2*  PLT 287  --   --  307  APTT  --  29  --   --   LABPROT  --  14.3  --   --   INR  --  1.2  --   --   HEPARINUNFRC  --   --  0.16* 0.24*  CREATININE 0.91  --   --   --   TROPONINIHS 5  --   --   --     Estimated Creatinine Clearance: 76.2 mL/min (by C-G formula based on SCr of 0.91 mg/dL).   Medications:  Confirmed no PTA anticoagulants   Assessment: Pharmacy consulted for heparin dosing in a patient who was diagnosed with COVID 19 ~ 2 weeks ago and is currently being treated for the virus. No baseline d-dimers presently.   Date and time Level Plan  8/19 2139 n/a Initial rate 1150 units/hr  8/20 0443 0.16, subtherapeutic Rate  increased to 1350 units/hr  8/20 1250 0.24, subtherapeutic No change, drip interrupted    Goal of Therapy:  Heparin level 0.3-0.7 units/ml Monitor platelets by anticoagulation protocol: Yes   Plan:  --0820 at 1250 HL = 0.24, subtherapeutic. Spoke with RN who reported drip had been interrupted due to line removal and heparin had only been re-started for a short time before the lab was drawn. Continue rate at 1350 units/hr, no changes --Check anti-Xa level at 1800 --Continue to monitor H&H and platelets   Benita Gutter 02/27/2020,1:33 PM

## 2020-02-27 NOTE — ED Notes (Signed)
Sitting on side of bed   Denies any problems at present

## 2020-02-27 NOTE — Progress Notes (Signed)
[facility]  PROGRESS NOTE  AADIL SUR CBJ:[id number] DOB: [birth year] DOA: 02/26/2020 PCP: Sofie Hartigan, MD   Brief HPI:   69 year old male with medical history of Parkinson disease, Lewy body dementia who was sent to ED by PCP for evaluation of shortness of breath.  Patient was diagnosed with COVID-19 viral infection 2 weeks ago he was tested positive on 02/13/2020 and was treated with short course of Augmentin, prednisone as well as bronchodilator therapy.  Patient symptomatically improved for past couple of days he developed worsening shortness of breath with minimal exertion.  Also complained of chest pain which was midsternal.  CTA showed extensive bilateral pulmonary emboli, moderate diffuse bilateral prominent groundglass appearing infiltrates. Patient started on IV heparin, remdesivir, Decadron.   Subjective   Patient seen and examined, denies shortness of breath.   Assessment/Plan:     1. Acute pulmonary embolism-likely from underlying COVID-19 infection, CTA showed bilateral PE with no right heart strain.  Patient started on heparin drip.  Echocardiogram has been ordered.  Will follow echo results. 2. Pneumonia due to COVID-19 infection-patient was tested positive for COVID-19 on 02/13/2020.  He was treated with steroid taper, antibiotics and bronchodilator therapy as outpatient.  In the hospital he has been started on remdesivir per pharmacy protocol.  Also started on Decadron 6 mg IV daily.  Continue oxygen supplementation as needed.  He is currently requiring 2 L/min of oxygen by nasal cannula. 3. 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  Continue buspirone, Namenda, Lexapro, Zoloft, Aricept, Seroquel 4. CAD-continue aspirin, statin, fish oil. 5. Bronchitis-patient started on Zithromax 500 mg daily for 5 days. 6. Parkinson disease-continue Sinemet     COVID-19 Labs  Recent Labs    02/27/20 1250  FERRITIN  532*    Lab Results  Component Value Date   [place] NEGATIVE 04/15/2019     Scheduled medications:   . vitamin C  500 mg Oral Daily  . aspirin EC  81 mg Oral Daily  . atorvastatin  10 mg Oral Daily  . azithromycin  500 mg Oral Daily  . busPIRone  30 mg Oral QHS  . Carbidopa-Levodopa ER  1 tablet Oral BID  . dexamethasone (DECADRON) injection  6 mg Intravenous Q24H  . donepezil  10 mg Oral QAC breakfast  . escitalopram  20 mg Oral Daily  . furosemide  40 mg Intravenous Daily  . loratadine  10 mg Oral Daily  . memantine  10 mg Oral Daily  . multivitamin with minerals  1 tablet Oral Daily  . omega-3 acid ethyl esters  1 capsule Oral Daily  . pantoprazole  40 mg Oral BID  . QUEtiapine  25 mg Oral Daily  . sertraline  100 mg Oral BID  . sodium chloride flush  3 mL Intravenous Q12H  . vitamin B-12  1,000 mcg Oral Daily  . zinc sulfate  220 mg Oral Daily         CBG: No results for input(s): GLUCAP in the last 168 hours.  SpO2: 93 % O2 Flow Rate (L/min): 2 L/min    CBC: Recent Labs  Lab 02/26/20 1050 02/27/20 1250  WBC 11.6* 15.2*  NEUTROABS 10.1* 13.6*  HGB 11.0* 10.9*  HCT 31.9* 32.2*  MCV 88.9 88.7  PLT 287 160    Basic Metabolic Panel: Recent Labs  Lab 02/26/20 1050 02/27/20 1250  NA 138 138  K 4.4 3.7  CL 106 105  CO2 23 21*  GLUCOSE 111* 115*  BUN 14 20  CREATININE 0.91 0.82  CALCIUM 8.2* 8.3*  MG  --  2.0  PHOS  --  2.5     Liver Function Tests: Recent Labs  Lab 02/26/20 1050 02/27/20 1250  AST 22 25  ALT 11 11  ALKPHOS 50 52  BILITOT 0.9 0.7  PROT 6.5 6.6  ALBUMIN 2.8* 2.8*     Antibiotics: Anti-infectives (From admission, onward)   Start     Dose/Rate Route Frequency Ordered Stop   02/27/20 1000  remdesivir 100 mg in sodium chloride 0.9 % 100 mL IVPB       "Followed by" Linked Group Details   100 mg 200 mL/hr over 30 Minutes Intravenous Daily 02/26/20 1612 03/02/20 0959   02/26/20 2200  azithromycin (ZITHROMAX)  tablet 500 mg        500 mg Oral Daily 02/26/20 1612     02/26/20 1830  remdesivir 200 mg in sodium chloride 0.9% 250 mL IVPB       "Followed by" Linked Group Details   200 mg 580 mL/hr over 30 Minutes Intravenous Once 02/26/20 1612 02/26/20 1828       DVT prophylaxis: Heparin  Code Status: Full code  Family Communication: No family at bedside    Status is: Inpatient  Dispo: The patient is from: Home              Anticipated d/c is to: Home              Anticipated d/c date is: 03/03/2020              Patient currently not medically stable for discharge  Barrier to discharge-pulmonary embolism, COVID-19 pneumonia       Consultants:    Procedures:   Objective   Vitals:   02/27/20 0200 02/27/20 0400 02/27/20 0758 02/27/20 1143  BP: 113/74 124/77  120/70  Pulse: (!) 53 (!) 54 (!) 54 70  Resp: 18 (!) 22 (!) 29 14  Temp:   98 F (36.7 C)   SpO2: 90% 93% 94% 93%  Weight:      Height:        Intake/Output Summary (Last 24 hours) at 02/27/2020 1437 Last data filed at 02/26/2020 2139 Gross per 24 hour  Intake --  Output 400 ml  Net -400 ml    08/18 1901 - 08/20 0700 In: -  Out: 400 [Urine:400]  Filed Weights   02/26/20 1135  Weight: 72.6 kg    Physical Examination:    General: Appears in no acute distress  Cardiovascular: S1-S2, regular, no murmur auscultated  Respiratory: Decreased breath sounds at lung bases  Abdomen: Abdomen is soft, nontender, no organomegaly  Extremities: No edema in the lower extremities  Neurologic: Alert, oriented x3, intact insight and judgment, no focal deficit noted    Data Reviewed:   Recent Results (from the past 240 hour(s))  Blood culture (routine x 2)     Status: None (Preliminary result)   Collection Time: 02/26/20 11:18 AM   Specimen: BLOOD  Result Value Ref Range Status   Specimen Description BLOOD RIGHT ANTECUBITAL  Final   Special Requests   Final    BOTTLES DRAWN AEROBIC AND ANAEROBIC Blood  Culture adequate volume   Culture   Final    NO GROWTH < 24 HOURS Performed at [facility], [address] [place], [place] [address]    Report Status PENDING  Incomplete  Blood culture (routine x 2)     Status:  None (Preliminary result)   Collection Time: 02/26/20 11:23 AM   Specimen: BLOOD  Result Value Ref Range Status   Specimen Description BLOOD RIGHT HAND  Final   Special Requests   Final    BOTTLES DRAWN AEROBIC AND ANAEROBIC Blood Culture adequate volume   Culture   Final    NO GROWTH < 24 HOURS Performed at [facility], [address] [place], [place] [address]    Report Status PENDING  Incomplete    No results for input(s): LIPASE, AMYLASE in the last 168 hours. No results for input(s): AMMONIA in the last 168 hours.  Cardiac Enzymes: No results for input(s): CKTOTAL, CKMB, CKMBINDEX, TROPONINI in the last 168 hours. BNP (last 3 results) Recent Labs    02/26/20 1050  BNP 214.3*    ProBNP (last 3 results) No results for input(s): PROBNP in the last 8760 hours.  Studies:  CT Angio Chest PE W and/or Wo Contrast  Result Date: 02/26/2020 CLINICAL DATA:  Hypoxia with left upper lobe mass on chest x-ray. EXAM: CT ANGIOGRAPHY CHEST WITH CONTRAST TECHNIQUE: Multidetector CT imaging of the chest was performed using the standard protocol during bolus administration of intravenous contrast. Multiplanar CT image reconstructions and MIPs were obtained to evaluate the vascular anatomy. CONTRAST:  57mL OMNIPAQUE IOHEXOL 350 MG/ML SOLN COMPARISON:  September 21, 2010 FINDINGS: Cardiovascular: Extensive intraluminal filling defects are seen throughout numerous branches of the bilateral pulmonary arteries. Involvement of the distal portions of the main pulmonary arteries is also seen. There is no evidence of saddle embolus or heart strain. The heart is normal in size. No pericardial effusion is identified. Moderate severity coronary artery calcification is noted.  Mediastinum/Nodes: No enlarged mediastinal, hilar, or axillary lymph nodes. Thyroid gland, trachea, and esophagus demonstrate no significant findings. Lungs/Pleura: Marked severity diffuse predominately ground-glass appearing infiltrates are seen throughout both lungs. There are small bilateral pleural effusions. No pneumothorax is identified. Upper Abdomen: Multiple surgical clips are seen within the gallbladder fossa. Musculoskeletal: Chronic compression fracture deformities are seen within the mid thoracic spine. Review of the MIP images confirms the above findings. IMPRESSION: 1. Extensive bilateral pulmonary emboli. 2. Marked severity diffuse bilateral predominately ground-glass appearing infiltrates. 3. Small bilateral pleural effusions. Electronically Signed   By: Virgina Norfolk M.D.   On: 02/26/2020 17:04   DG Chest Portable 1 View  Result Date: 02/26/2020 CLINICAL DATA:  Short of breath.  Coronavirus infection. EXAM: PORTABLE CHEST 1 VIEW COMPARISON:  09/08/2010 FINDINGS: Heart and mediastinal shadows are normal. Widespread hazy and patchy pulmonary infiltrates consistent with viral pneumonia. No dense consolidation, collapse or effusion. Density of the left upper chest favored to represent artifactual density. If not, a left upper lobe mass could be present. IMPRESSION: Widespread hazy and patchy pulmonary infiltrates consistent with viral pneumonia. Question focal density in the left upper lobe. This could be artifactual or could represent focal infiltrate or a mass lesion. Electronically Signed   By: Nelson Chimes M.D.   On: 02/26/2020 11:41       [place]   [facility] If 7PM-7AM, please contact night-coverage at [email or web address], Office  [phone]   02/27/2020, 2:37 PM  LOS: 1 day

## 2020-02-28 ENCOUNTER — Inpatient Hospital Stay (HOSPITAL_COMMUNITY)
Admit: 2020-02-28 | Discharge: 2020-02-28 | Disposition: A | Discharge status: Home / Self Care | Payer: Medicare Other | Attending: Internal Medicine | Admitting: Internal Medicine

## 2020-02-28 DIAGNOSIS — I361 Nonrheumatic tricuspid (valve) insufficiency: Secondary | ICD-10-CM

## 2020-02-28 LAB — ECHOCARDIOGRAM COMPLETE
AR max vel: 2.3 cm2
AV Area VTI: 2.56 cm2
AV Area mean vel: 2.4 cm2
AV Mean grad: 5 mmHg
AV Peak grad: 8.1 mmHg
Ao pk vel: 1.42 m/s
Area-P 1/2: 2.04 cm2
Height: 68 in
S' Lateral: 2.49 cm
Weight: 2560 oz

## 2020-02-28 LAB — CBC WITH DIFFERENTIAL/PLATELET
Abs Immature Granulocytes: 0.15 10*3/uL — ABNORMAL HIGH (ref 0.00–0.07)
Basophils Absolute: 0 10*3/uL (ref 0.0–0.1)
Basophils Relative: 0 %
Eosinophils Absolute: 0 10*3/uL (ref 0.0–0.5)
Eosinophils Relative: 0 %
HCT: 34.3 % — ABNORMAL LOW (ref 39.0–52.0)
Hemoglobin: 11.9 g/dL — ABNORMAL LOW (ref 13.0–17.0)
Immature Granulocytes: 1 %
Lymphocytes Relative: 3 %
Lymphs Abs: 0.4 10*3/uL — ABNORMAL LOW (ref 0.7–4.0)
MCH: 30.9 pg (ref 26.0–34.0)
MCHC: 34.7 g/dL (ref 30.0–36.0)
MCV: 89.1 fL (ref 80.0–100.0)
Monocytes Absolute: 0.4 10*3/uL (ref 0.1–1.0)
Monocytes Relative: 4 %
Neutro Abs: 10.5 10*3/uL — ABNORMAL HIGH (ref 1.7–7.7)
Neutrophils Relative %: 92 %
Platelets: 200 10*3/uL (ref 150–400)
RBC: 3.85 MIL/uL — ABNORMAL LOW (ref 4.22–5.81)
RDW: 12.1 % (ref 11.5–15.5)
WBC: 11.4 10*3/uL — ABNORMAL HIGH (ref 4.0–10.5)
nRBC: 0 % (ref 0.0–0.2)

## 2020-02-28 LAB — MAGNESIUM: Magnesium: 2.4 mg/dL (ref 1.7–2.4)

## 2020-02-28 LAB — C-REACTIVE PROTEIN: CRP: 9.4 mg/dL — ABNORMAL HIGH (ref <1.0)

## 2020-02-28 LAB — FERRITIN: Ferritin: 775 ng/mL — ABNORMAL HIGH (ref 24–336)

## 2020-02-28 LAB — COMPREHENSIVE METABOLIC PANEL
ALT: 64 U/L — ABNORMAL HIGH (ref 0–44)
AST: 80 U/L — ABNORMAL HIGH (ref 15–41)
Albumin: 2.8 g/dL — ABNORMAL LOW (ref 3.5–5.0)
Alkaline Phosphatase: 50 U/L (ref 38–126)
Anion gap: 11 (ref 5–15)
BUN: 42 mg/dL — ABNORMAL HIGH (ref 8–23)
CO2: 21 mmol/L — ABNORMAL LOW (ref 22–32)
Calcium: 8.6 mg/dL — ABNORMAL LOW (ref 8.9–10.3)
Chloride: 102 mmol/L (ref 98–111)
Creatinine, Ser: 1.49 mg/dL — ABNORMAL HIGH (ref 0.61–1.24)
GFR calc Af Amer: 55 mL/min — ABNORMAL LOW (ref >60)
GFR calc non Af Amer: 48 mL/min — ABNORMAL LOW (ref >60)
Glucose, Bld: 138 mg/dL — ABNORMAL HIGH (ref 70–99)
Potassium: 4.7 mmol/L (ref 3.5–5.1)
Sodium: 134 mmol/L — ABNORMAL LOW (ref 135–145)
Total Bilirubin: 0.8 mg/dL (ref 0.3–1.2)
Total Protein: 6 g/dL — ABNORMAL LOW (ref 6.5–8.1)

## 2020-02-28 LAB — PHOSPHORUS: Phosphorus: 3.2 mg/dL (ref 2.5–4.6)

## 2020-02-28 LAB — FIBRIN DERIVATIVES D-DIMER (ARMC ONLY): Fibrin derivatives D-dimer (ARMC): >7500 ng/mL (FEU) — ABNORMAL HIGH (ref 0.00–499.00)

## 2020-02-28 LAB — HEPARIN LEVEL (UNFRACTIONATED)
Heparin Unfractionated: <0.1 IU/mL — ABNORMAL LOW (ref 0.30–0.70)
Heparin Unfractionated: 0.62 IU/mL (ref 0.30–0.70)
Heparin Unfractionated: 0.76 IU/mL — ABNORMAL HIGH (ref 0.30–0.70)

## 2020-02-28 MED ORDER — HEPARIN BOLUS VIA INFUSION
2000.0000 [IU] | Freq: Once | INTRAVENOUS | Status: AC
  Administered 2020-02-28: 04:00:00 2000 [IU] via INTRAVENOUS
  Filled 2020-02-28: qty 2000

## 2020-02-28 NOTE — Consult Note (Signed)
ANTICOAGULATION CONSULT NOTE  Pharmacy Consult for Heparin  Indication: Pulmonary Embolism  Allergies  Allergen Reactions  . Benadryl [Diphenhydramine]   . Neosporin [Bacitracin-Polymyxin B]     Patient Measurements: Height: 5\' 8"  (172.7 cm) Weight: 72.6 kg (160 lb) IBW/kg (Calculated) : 68.4 Heparin Dosing Weight: 72.6 kg   Vital Signs: Temp: 98.2 F (36.8 C) (08/21 0040) Temp Source: Oral (08/21 0040) BP: 94/58 (08/21 0040) Pulse Rate: 55 (08/21 0040)  Labs: Recent Labs     0000 02/26/20 1050 02/26/20 1815 02/27/20 0443 02/27/20 1250 02/27/20 1749 02/28/20 0123  HGB   < > 11.0*  --   --  10.9*  --  11.9*  HCT  --  31.9*  --   --  32.2*  --  34.3*  PLT  --  287  --   --  307  --  200  APTT  --   --  29  --   --   --   --   LABPROT  --   --  14.3  --   --   --   --   INR  --   --  1.2  --   --   --   --   HEPARINUNFRC  --   --   --    < > 0.24* 0.31 <0.10*  CREATININE  --  0.91  --   --  0.82  --  1.49*  TROPONINIHS  --  5  --   --   --   --   --    < > = values in this interval not displayed.    Estimated Creatinine Clearance: 46.5 mL/min (A) (by C-G formula based on SCr of 1.49 mg/dL (H)).   Medications:  Confirmed no PTA anticoagulants   Assessment: Pharmacy consulted for heparin dosing in a patient who was diagnosed with COVID 19 ~ 2 weeks ago and is currently being treated for the virus. No baseline d-dimers presently.   Date and time Level Plan  8/19 2139 n/a Initial rate 1150 units/hr  8/20 0443 0.16, subtherapeutic Rate  increased to 1350 units/hr  8/20 1250 0.24, subtherapeutic No change, drip interrupted    Goal of Therapy:  Heparin level 0.3-0.7 units/ml Monitor platelets by anticoagulation protocol: Yes   Plan:  --0821 at 0123 HL = < 0.10, CBC stable.  Confirmed w/ RN no problems w/ infusion.  Will rebolus w/ 2000 units and increase rate to 1550 units/hr --Check anti-Xa level  In 6 hrs after increase --Continue to monitor H&H and  platelets   [place] Crane, Aubrynn Katona A 02/28/2020,3:37 AM

## 2020-02-28 NOTE — TOC Initial Note (Signed)
Transition of Care [facility]) - Initial/Assessment Note    Patient Details  Name: Darren Wang MRN: [id number] Date of Birth: 1951-06-06  Transition of Care [facility]) CM/SW Contact:    Shelbie Hutching, RN Phone Number: 02/28/2020, 11:07 AM  Clinical Narrative:                 Patient admitted to the hospital with COVID 19.  Patient was diagnosed on 8/6 but didn't require hospitalization until the 19th due to pulmonary embolism caused by the COVID.  Patient is currently on acute O2 and heparin drip.  Patient is alert and oriented while talking on the phone.  This RNCM explained role in discharge planning and patient verbalizes understanding.  Patient reports that he lives at home with his wife, Thayer Headings, and his adult son, Gwyndolyn Saxon.  Patient is independent at home and walks with a cane.  Patient's wife provides transportation the patient no longer drives.  Patient agrees to home health services at discharge.  Home health referral given to [facility] with [place] for RN, PT, and OT.  Patient is current with his PCP and [facility] for prescriptions.   TOC team will cont to follow and assist with discharge.   Expected Discharge Plan: [place] Barriers to Discharge: Continued Medical Work up   Patient Goals and CMS Choice Patient states their goals for this hospitalization and ongoing recovery are:: to get rid of this stuff in my lungs CMS Medicare.gov Compare Post Acute Care list provided to:: Patient Choice offered to / list presented to : Patient  Expected Discharge Plan and Services Expected Discharge Plan: [place]   Discharge Planning Services: CM Consult Post Acute Care Choice: [place] arrangements for the past 2 months: Mobile Home                           HH Arranged: RN, PT, OT [facility] Agency: [place] ([place]) Date [place]: 02/28/20 Time HH Agency Contacted: 1104 Representative spoke with at [place]: Floydene Flock  Prior Living Arrangements/Services Living arrangements for the past 2 months: Mobile Home Lives with:: Adult Children, Spouse Patient language and need for interpreter reviewed:: Yes Do you feel safe going back to the place where you live?: Yes      Need for Family Participation in Patient Care: Yes (Comment) (COVID) Care giver support system in place?: Yes (comment) (wife) Current home services: DME (cane) Criminal Activity/Legal Involvement Pertinent to Current Situation/Hospitalization: No - Comment as needed  Activities of Daily Living Home Assistive Devices/Equipment: Cane (specify quad or straight) ADL Screening (condition at time of admission) Patient's cognitive ability adequate to safely complete daily activities?: Yes Is the patient deaf or have difficulty hearing?: No Does the patient have difficulty seeing, even when wearing glasses/contacts?: No Does the patient have difficulty concentrating, remembering, or making decisions?: No Patient able to express need for assistance with ADLs?: Yes Does the patient have difficulty dressing or bathing?: No Independently performs ADLs?: Yes (appropriate for developmental age) Does the patient have difficulty walking or climbing stairs?: No Weakness of Legs: None Weakness of Arms/Hands: None  Permission Sought/Granted Permission sought to share information with : Case Manager, Family Supports, Other (comment) Permission granted to share information with : Yes, Verbal Permission Granted  Share Information with NAME: Thayer Headings  Permission granted to share info w AGENCY: [place]  Emotional Assessment   Attitude/Demeanor/Rapport: Engaged Affect (typically observed): Accepting Orientation: : Oriented to Self, Oriented to Place, Oriented to  Time, Oriented to Situation Alcohol / Substance Use: Not Applicable Psych Involvement: No (comment)  Admission diagnosis:  Shortness of breath  [R06.02] Other chest pain [R07.89] Hypoxia [R09.02] Pneumonia due to COVID-19 virus [U07.1, J12.82] COVID-19 [U07.1] Patient Active Problem List   Diagnosis Date Noted  . Pneumonia due to COVID-19 virus 02/26/2020  . Pulmonary embolism ([place]) 02/26/2020  . Dementia ([place])   . Parkinson disease ([place]) 08/04/2015  . Suicidal ideation 08/04/2015  . Major depressive disorder, recurrent severe without psychotic features ([place]) 08/04/2015  . Lewy body dementia ([place]) 08/04/2015   PCP:  Sofie Hartigan, MD Pharmacy:   [facility] [address], [place] - [place] [address] Phone: [phone] Fax: [place], [place] - [address] [place] [address] Phone: [phone] Fax: [phone]  [id number] [place], [place] [address] Phone: [phone] Fax: [phone]     Social Determinants of Health (SDOH) Interventions    Readmission Risk Interventions Readmission Risk Prevention Plan 02/28/2020  Transportation Screening Complete  PCP or Specialist Appt within 3-5 Days Complete  HRI or [place] Complete  Social Work Consult for [place] Planning/Counseling Complete  Palliative Care Screening Complete  Medication Review Press photographer) Complete  Some recent data might be hidden

## 2020-02-28 NOTE — Consult Note (Signed)
ANTICOAGULATION CONSULT NOTE  Pharmacy Consult for Heparin  Indication: Pulmonary Embolism  Allergies  Allergen Reactions  . Benadryl [Diphenhydramine]   . Neosporin [Bacitracin-Polymyxin B]     Patient Measurements: Height: 5\' 8"  (172.7 cm) Weight: 72.6 kg (160 lb) IBW/kg (Calculated) : 68.4 Heparin Dosing Weight: 72.6 kg   Vital Signs: Temp: 97.3 F (36.3 C) (08/21 1201) Temp Source: Oral (08/21 0753) BP: 106/68 (08/21 1201) Pulse Rate: 57 (08/21 1201)  Labs: Recent Labs     0000 02/26/20 1050 02/26/20 1815 02/27/20 0443 02/27/20 1250 02/27/20 1749 02/28/20 0123 02/28/20 0953 02/28/20 1558  HGB   < > 11.0*  --   --  10.9*  --  11.9*  --   --   HCT  --  31.9*  --   --  32.2*  --  34.3*  --   --   PLT  --  287  --   --  307  --  200  --   --   APTT  --   --  29  --   --   --   --   --   --   LABPROT  --   --  14.3  --   --   --   --   --   --   INR  --   --  1.2  --   --   --   --   --   --   HEPARINUNFRC  --   --   --    < > 0.24*   < > <0.10* 0.62 0.76*  CREATININE  --  0.91  --   --  0.82  --  1.49*  --   --   TROPONINIHS  --  5  --   --   --   --   --   --   --    < > = values in this interval not displayed.    Estimated Creatinine Clearance: 46.5 mL/min (A) (by C-G formula based on SCr of 1.49 mg/dL (H)).   Medications:  Confirmed no PTA anticoagulants   Assessment: Pharmacy consulted for heparin dosing in a patient who was diagnosed with COVID 19 ~ 2 weeks ago and is currently being treated for the virus. No baseline d-dimers presently.   Date and time Level Plan  8/19 2139 n/a Initial rate 1150 units/hr  8/20 0443 0.16, subtherapeutic Rate  increased to 1350 units/hr  8/20 1250 0.24, subtherapeutic No change, drip interrupted  8/21 0953 0.62 therapeutic  No chg  8/21 1640 0.76 supratherapeutic  -----    Goal of Therapy:  Heparin level 0.3-0.7 units/ml Monitor platelets by anticoagulation protocol: Yes   Plan:  --0821 at 1640 HL = 0.76,  slightly supratherapeutic. CBC stable.  Will REDUCE curent rate to 1500 units/hr - notified nursing of changes.  --Continue to monitor H&H and platelets   Ellorie Kindall R Tyronn Golda 02/28/2020,4:47 PM

## 2020-02-28 NOTE — Progress Notes (Signed)
[facility]  PROGRESS NOTE  Darren Wang OEU:[id number] DOB: 07-Dec-1950 DOA: 02/26/2020 PCP: Sofie Hartigan, MD   Brief HPI:   69 year old male with medical history of Parkinson disease, Lewy body dementia who was sent to ED by PCP for evaluation of shortness of breath.  Patient was diagnosed with COVID-19 viral infection 2 weeks ago he was tested positive on 02/13/2020 and was treated with short course of Augmentin, prednisone as well as bronchodilator therapy.  Patient symptomatically improved for past couple of days he developed worsening shortness of breath with minimal exertion.  Also complained of chest pain which was midsternal.  CTA showed extensive bilateral pulmonary emboli, moderate diffuse bilateral prominent groundglass appearing infiltrates. Patient started on IV heparin, remdesivir, Decadron.   Subjective   Patient seen and examined, denies any complaints.  Renal function little worse this morning.  Breathing is okay.   Assessment/Plan:     1. Acute pulmonary embolism-likely from underlying COVID-19 infection, CTA showed bilateral PE with no right heart strain.  Patient started on heparin drip.  Echocardiogram showed normal RV function.  No right heart strain.  Normal pulmonary pressure.  2. Pneumonia due to COVID-19 infection-patient was tested positive for COVID-19 on 02/13/2020.  He was treated with steroid taper, antibiotics and bronchodilator therapy as outpatient.  In the hospital he has been started on remdesivir per pharmacy protocol.  Also started on Decadron 6 mg IV daily.  Continue oxygen supplementation as needed.  He is currently not requiring oxygen.  CRP is 11.  3.  Follow CRP in a.m. 3. Acute kidney injury-likely in setting of diuresis with IV Lasix started yesterday.  Will hold Lasix.  Patient baseline creatinine is around 0.9.  Today creatinine is 1.49.  Follow BMP in am. 4. Dementia/depression-patient has history of global dementia secondary to  Parkinson's disease.  Continue buspirone, Namenda, Lexapro, Zoloft, Aricept, Seroquel 5. CAD-continue aspirin, statin, fish oil. 6. Bronchitis-patient started on Zithromax 500 mg daily for 5 days. 7. Parkinson disease-continue Sinemet     COVID-19 Labs  Recent Labs    02/27/20 1250 02/28/20 0123  FERRITIN 532* 775*  CRP 11.3*  --     Lab Results  Component Value Date   [place] NEGATIVE 04/15/2019     Scheduled medications:   . vitamin C  500 mg Oral Daily  . aspirin EC  81 mg Oral Daily  . atorvastatin  10 mg Oral Daily  . azithromycin  500 mg Oral Daily  . busPIRone  30 mg Oral QHS  . Carbidopa-Levodopa ER  1 tablet Oral BID  . dexamethasone (DECADRON) injection  6 mg Intravenous Q24H  . donepezil  10 mg Oral QAC breakfast  . escitalopram  20 mg Oral Daily  . loratadine  10 mg Oral Daily  . memantine  10 mg Oral Daily  . multivitamin with minerals  1 tablet Oral Daily  . omega-3 acid ethyl esters  1 capsule Oral Daily  . pantoprazole  40 mg Oral BID  . QUEtiapine  25 mg Oral Daily  . sertraline  100 mg Oral BID  . sodium chloride flush  3 mL Intravenous Q12H  . vitamin B-12  1,000 mcg Oral Daily  . zinc sulfate  220 mg Oral Daily         CBG: No results for input(s): GLUCAP in the last 168 hours.  SpO2: 96 % O2 Flow Rate (L/min): 2 L/min    CBC: Recent Labs  Lab 02/26/20 1050 02/27/20 1250 02/28/20  0123  WBC 11.6* 15.2* 11.4*  NEUTROABS 10.1* 13.6* 10.5*  HGB 11.0* 10.9* 11.9*  HCT 31.9* 32.2* 34.3*  MCV 88.9 88.7 89.1  PLT 287 307 454    Basic Metabolic Panel: Recent Labs  Lab 02/26/20 1050 02/27/20 1250 02/28/20 0123  NA 138 138 134*  K 4.4 3.7 4.7  CL 106 105 102  CO2 23 21* 21*  GLUCOSE 111* 115* 138*  BUN 14 20 42*  CREATININE 0.91 0.82 1.49*  CALCIUM 8.2* 8.3* 8.6*  MG  --  2.0 2.4  PHOS  --  2.5 3.2     Liver Function Tests: Recent Labs  Lab 02/26/20 1050 02/27/20 1250 02/28/20 0123  AST 22 25 80*  ALT 11 11  64*  ALKPHOS 50 52 50  BILITOT 0.9 0.7 0.8  PROT 6.5 6.6 6.0*  ALBUMIN 2.8* 2.8* 2.8*     Antibiotics: Anti-infectives (From admission, onward)   Start     Dose/Rate Route Frequency Ordered Stop   02/27/20 1000  remdesivir 100 mg in sodium chloride 0.9 % 100 mL IVPB       "Followed by" Linked Group Details   100 mg 200 mL/hr over 30 Minutes Intravenous Daily 02/26/20 1612 03/02/20 0959   02/26/20 2200  azithromycin (ZITHROMAX) tablet 500 mg        500 mg Oral Daily 02/26/20 1612     02/26/20 1830  remdesivir 200 mg in sodium chloride 0.9% 250 mL IVPB       "Followed by" Linked Group Details   200 mg 580 mL/hr over 30 Minutes Intravenous Once 02/26/20 1612 02/27/20 1536       DVT prophylaxis: Heparin  Code Status: Full code  Family Communication: Spoke to patient's wife on phone.    Status is: Inpatient  Dispo: The patient is from: Home              Anticipated d/c is to: Home              Anticipated d/c date is: 03/03/2020              Patient currently not medically stable for discharge  Barrier to discharge-pulmonary embolism, COVID-19 pneumonia       Consultants:    Procedures:   Objective   Vitals:   02/28/20 0040 02/28/20 0456 02/28/20 0753 02/28/20 1201  BP: (!) 94/58 95/60 101/68 106/68  Pulse: (!) 55 (!) 57 (!) 55 (!) 57  Resp: 17 20 17 17   Temp: 98.2 F (36.8 C) 98 F (36.7 C) 97.9 F (36.6 C) (!) 97.3 F (36.3 C)  TempSrc: Oral Oral Oral   SpO2: 95% 95% 95% 96%  Weight:      Height:        Intake/Output Summary (Last 24 hours) at 02/28/2020 1209 Last data filed at 02/28/2020 0981 Gross per 24 hour  Intake --  Output 2300 ml  Net -2300 ml    08/19 1901 - 08/21 0700 In: -  Out: 2250 [Urine:2250]  Filed Weights   02/26/20 1135  Weight: 72.6 kg    Physical Examination:   General-appears in no acute distress  Heart-S1-S2, regular, no murmur auscultated  Lungs-clear to auscultation bilaterally, no wheezing or crackles  auscultated  Abdomen-soft, nontender, no organomegaly  Extremities-no edema in the lower extremities  Neuro-alert, oriented x3, no focal deficit noted    Data Reviewed:   Recent Results (from the past 240 hour(s))  Blood culture (routine x 2)  Status: None (Preliminary result)   Collection Time: 02/26/20 11:18 AM   Specimen: BLOOD  Result Value Ref Range Status   Specimen Description BLOOD RIGHT ANTECUBITAL  Final   Special Requests   Final    BOTTLES DRAWN AEROBIC AND ANAEROBIC Blood Culture adequate volume   Culture   Final    NO GROWTH 2 DAYS Performed at [facility], [address], [place], [place] [address]    Report Status PENDING  Incomplete  Blood culture (routine x 2)     Status: None (Preliminary result)   Collection Time: 02/26/20 11:23 AM   Specimen: BLOOD  Result Value Ref Range Status   Specimen Description BLOOD RIGHT HAND  Final   Special Requests   Final    BOTTLES DRAWN AEROBIC AND ANAEROBIC Blood Culture adequate volume   Culture   Final    NO GROWTH 2 DAYS Performed at [facility], [address] [place], [place] [address]    Report Status PENDING  Incomplete    No results for input(s): LIPASE, AMYLASE in the last 168 hours. No results for input(s): AMMONIA in the last 168 hours.  Cardiac Enzymes: No results for input(s): CKTOTAL, CKMB, CKMBINDEX, TROPONINI in the last 168 hours. BNP (last 3 results) Recent Labs    02/26/20 1050  BNP 214.3*    ProBNP (last 3 results) No results for input(s): PROBNP in the last 8760 hours.  Studies:  CT Angio Chest PE W and/or Wo Contrast  Result Date: 02/26/2020 CLINICAL DATA:  Hypoxia with left upper lobe mass on chest x-ray. EXAM: CT ANGIOGRAPHY CHEST WITH CONTRAST TECHNIQUE: Multidetector CT imaging of the chest was performed using the standard protocol during bolus administration of intravenous contrast. Multiplanar CT image reconstructions and MIPs were obtained to evaluate  the vascular anatomy. CONTRAST:  59mL OMNIPAQUE IOHEXOL 350 MG/ML SOLN COMPARISON:  September 21, 2010 FINDINGS: Cardiovascular: Extensive intraluminal filling defects are seen throughout numerous branches of the bilateral pulmonary arteries. Involvement of the distal portions of the main pulmonary arteries is also seen. There is no evidence of saddle embolus or heart strain. The heart is normal in size. No pericardial effusion is identified. Moderate severity coronary artery calcification is noted. Mediastinum/Nodes: No enlarged mediastinal, hilar, or axillary lymph nodes. Thyroid gland, trachea, and esophagus demonstrate no significant findings. Lungs/Pleura: Marked severity diffuse predominately ground-glass appearing infiltrates are seen throughout both lungs. There are small bilateral pleural effusions. No pneumothorax is identified. Upper Abdomen: Multiple surgical clips are seen within the gallbladder fossa. Musculoskeletal: Chronic compression fracture deformities are seen within the mid thoracic spine. Review of the MIP images confirms the above findings. IMPRESSION: 1. Extensive bilateral pulmonary emboli. 2. Marked severity diffuse bilateral predominately ground-glass appearing infiltrates. 3. Small bilateral pleural effusions. Electronically Signed   By: Virgina Norfolk M.D.   On: 02/26/2020 17:04   ECHOCARDIOGRAM COMPLETE  Result Date: 02/28/2020    ECHOCARDIOGRAM REPORT   Patient Name:   JAYDEE CONRAN Date of Exam: 02/28/2020 Medical Rec #:  [id number]      Height:       68.0 in Accession #:    [id number]     Weight:       160.0 lb Date of Birth:  1951/02/27      BSA:          1.859 m Patient Age:    34 years       BP:           101/68 mmHg  Patient Gender: M              HR:           55 bpm. Exam Location:  [facility] Procedure: 2D Echo, Cardiac Doppler and Color Doppler Indications:     Chest pain 786.50  History:         Patient has no prior history of Echocardiogram examinations.                   Previous Myocardial Infarction; TIA.  Sonographer:     Sherrie Sport RDCS (AE) Referring Phys:  [place] Diagnosing Phys: Kathlyn Sacramento MD IMPRESSIONS  1. Left ventricular ejection fraction, by estimation, is 60 to 65%. The left ventricle has normal function. The left ventricle has no regional wall motion abnormalities. There is mild left ventricular hypertrophy. Left ventricular diastolic parameters were normal.  2. Right ventricular systolic function is normal. The right ventricular size is normal. There is normal pulmonary artery systolic pressure. The estimated right ventricular systolic pressure is 48.5 mmHg.  3. The mitral valve is normal in structure. No evidence of mitral valve regurgitation. No evidence of mitral stenosis.  4. The aortic valve is normal in structure. Aortic valve regurgitation is not visualized. Mild aortic valve sclerosis is present, with no evidence of aortic valve stenosis. FINDINGS  Left Ventricle: Left ventricular ejection fraction, by estimation, is 60 to 65%. The left ventricle has normal function. The left ventricle has no regional wall motion abnormalities. The left ventricular internal cavity size was normal in size. There is  mild left ventricular hypertrophy. Left ventricular diastolic parameters were normal. Right Ventricle: The right ventricular size is normal. No increase in right ventricular wall thickness. Right ventricular systolic function is normal. There is normal pulmonary artery systolic pressure. The tricuspid regurgitant velocity is 2.70 m/s, and  with an assumed right atrial pressure of 5 mmHg, the estimated right ventricular systolic pressure is 46.2 mmHg. Left Atrium: Left atrial size was normal in size. Right Atrium: Right atrial size was normal in size. Pericardium: There is no evidence of pericardial effusion. Mitral Valve: The mitral valve is normal in structure. Normal mobility of the mitral valve leaflets. No evidence of mitral valve  regurgitation. No evidence of mitral valve stenosis. Tricuspid Valve: The tricuspid valve is normal in structure. Tricuspid valve regurgitation is mild . No evidence of tricuspid stenosis. Aortic Valve: The aortic valve is normal in structure. Aortic valve regurgitation is not visualized. Mild aortic valve sclerosis is present, with no evidence of aortic valve stenosis. Aortic valve mean gradient measures 5.0 mmHg. Aortic valve peak gradient measures 8.1 mmHg. Aortic valve area, by VTI measures 2.56 cm. Pulmonic Valve: The pulmonic valve was normal in structure. Pulmonic valve regurgitation is not visualized. No evidence of pulmonic stenosis. Aorta: The aortic root is normal in size and structure. Venous: The inferior vena cava was not well visualized. IAS/Shunts: No atrial level shunt detected by color flow Doppler.  LEFT VENTRICLE PLAX 2D LVIDd:         4.02 cm  Diastology LVIDs:         2.49 cm  LV e' lateral:   9.79 cm/s LV PW:         1.14 cm  LV E/e' lateral: 8.9 LV IVS:        1.22 cm  LV e' medial:    6.85 cm/s LVOT diam:     2.00 cm  LV E/e' medial:  12.7 LV SV:  75 LV SV Index:   40 LVOT Area:     3.14 cm  RIGHT VENTRICLE RV S prime:     13.80 cm/s TAPSE (M-mode): 4.6 cm LEFT ATRIUM           Index       RIGHT ATRIUM           Index LA diam:      3.10 cm 1.67 cm/m  RA Area:     18.90 cm LA Vol (A2C): 55.3 ml 29.75 ml/m RA Volume:   55.40 ml  29.80 ml/m LA Vol (A4C): 37.5 ml 20.17 ml/m  AORTIC VALVE                    PULMONIC VALVE AV Area (Vmax):    2.30 cm     PV Vmax:        0.91 m/s AV Area (Vmean):   2.40 cm     PV Peak grad:   3.3 mmHg AV Area (VTI):     2.56 cm     RVOT Peak grad: 4 mmHg AV Vmax:           142.00 cm/s AV Vmean:          100.000 cm/s AV VTI:            0.293 m AV Peak Grad:      8.1 mmHg AV Mean Grad:      5.0 mmHg LVOT Vmax:         104.00 cm/s LVOT Vmean:        76.500 cm/s LVOT VTI:          0.239 m LVOT/AV VTI ratio: 0.82  AORTA Ao Root diam: 3.30 cm MITRAL VALVE                TRICUSPID VALVE MV Area (PHT): 2.04 cm    TR Peak grad:   29.2 mmHg MV Decel Time: 372 msec    TR Vmax:        270.00 cm/s MV E velocity: 86.80 cm/s MV A velocity: 55.70 cm/s  SHUNTS MV E/A ratio:  1.56        Systemic VTI:  0.24 m                            Systemic Diam: 2.00 cm Kathlyn Sacramento MD Electronically signed by Kathlyn Sacramento MD Signature Date/Time: 02/28/2020/11:47:07 AM    Final        Oswald Hillock   [facility] If 7PM-7AM, please contact night-coverage at [email or web address], Office  [phone]   02/28/2020, 12:09 PM  LOS: 2 days

## 2020-02-28 NOTE — Progress Notes (Signed)
*  PRELIMINARY RESULTS* Echocardiogram 2D Echocardiogram has been performed.  Darren Wang 02/28/2020, 9:52 AM

## 2020-02-28 NOTE — Consult Note (Signed)
ANTICOAGULATION CONSULT NOTE  Pharmacy Consult for Heparin  Indication: Pulmonary Embolism  Allergies  Allergen Reactions  . Benadryl [Diphenhydramine]   . Neosporin [Bacitracin-Polymyxin B]     Patient Measurements: Height: 5\' 8"  (172.7 cm) Weight: 72.6 kg (160 lb) IBW/kg (Calculated) : 68.4 Heparin Dosing Weight: 72.6 kg   Vital Signs: Temp: 97.3 F (36.3 C) (08/21 1201) Temp Source: Oral (08/21 0753) BP: 106/68 (08/21 1201) Pulse Rate: 57 (08/21 1201)  Labs: Recent Labs     0000 02/26/20 1050 02/26/20 1815 02/27/20 0443 02/27/20 1250 02/27/20 1250 02/27/20 1749 02/28/20 0123 02/28/20 0953  HGB   < > 11.0*  --   --  10.9*  --   --  11.9*  --   HCT  --  31.9*  --   --  32.2*  --   --  34.3*  --   PLT  --  287  --   --  307  --   --  200  --   APTT  --   --  29  --   --   --   --   --   --   LABPROT  --   --  14.3  --   --   --   --   --   --   INR  --   --  1.2  --   --   --   --   --   --   HEPARINUNFRC  --   --   --    < > 0.24*   < > 0.31 <0.10* 0.62  CREATININE  --  0.91  --   --  0.82  --   --  1.49*  --   TROPONINIHS  --  5  --   --   --   --   --   --   --    < > = values in this interval not displayed.    Estimated Creatinine Clearance: 46.5 mL/min (A) (by C-G formula based on SCr of 1.49 mg/dL (H)).   Medications:  Confirmed no PTA anticoagulants   Assessment: Pharmacy consulted for heparin dosing in a patient who was diagnosed with COVID 19 ~ 2 weeks ago and is currently being treated for the virus. No baseline d-dimers presently.   Date and time Level Plan  8/19 2139 n/a Initial rate 1150 units/hr  8/20 0443 0.16, subtherapeutic Rate  increased to 1350 units/hr  8/20 1250 0.24, subtherapeutic No change, drip interrupted  8/21 0953 0.62 therapeutic  No chg        Goal of Therapy:  Heparin level 0.3-0.7 units/ml Monitor platelets by anticoagulation protocol: Yes   Plan:  --0821 at 0953 HL = 0.62, CBC stable.  Will continue curent rate of  1550 units/hr --Check confirmatory anti-Xa level  In 6 hrs  --Continue to monitor H&H and platelets   Schyler Counsell A 02/28/2020,12:13 PM

## 2020-02-29 DIAGNOSIS — G20 Parkinson's disease: Secondary | ICD-10-CM

## 2020-02-29 LAB — CBC WITH DIFFERENTIAL/PLATELET
Abs Immature Granulocytes: 0.09 10*3/uL — ABNORMAL HIGH (ref 0.00–0.07)
Basophils Absolute: 0 10*3/uL (ref 0.0–0.1)
Basophils Relative: 1 %
Eosinophils Absolute: 0.1 10*3/uL (ref 0.0–0.5)
Eosinophils Relative: 1 %
HCT: 33.4 % — ABNORMAL LOW (ref 39.0–52.0)
Hemoglobin: 11.3 g/dL — ABNORMAL LOW (ref 13.0–17.0)
Immature Granulocytes: 1 %
Lymphocytes Relative: 22 %
Lymphs Abs: 2 10*3/uL (ref 0.7–4.0)
MCH: 30 pg (ref 26.0–34.0)
MCHC: 33.8 g/dL (ref 30.0–36.0)
MCV: 88.6 fL (ref 80.0–100.0)
Monocytes Absolute: 0.7 10*3/uL (ref 0.1–1.0)
Monocytes Relative: 8 %
Neutro Abs: 6 10*3/uL (ref 1.7–7.7)
Neutrophils Relative %: 67 %
Platelets: 331 10*3/uL (ref 150–400)
RBC: 3.77 MIL/uL — ABNORMAL LOW (ref 4.22–5.81)
RDW: 14 % (ref 11.5–15.5)
WBC: 8.9 10*3/uL (ref 4.0–10.5)
nRBC: 0 % (ref 0.0–0.2)

## 2020-02-29 LAB — COMPREHENSIVE METABOLIC PANEL
ALT: 14 U/L (ref 0–44)
AST: 26 U/L (ref 15–41)
Albumin: 3 g/dL — ABNORMAL LOW (ref 3.5–5.0)
Alkaline Phosphatase: 50 U/L (ref 38–126)
Anion gap: 12 (ref 5–15)
BUN: 22 mg/dL (ref 8–23)
CO2: 26 mmol/L (ref 22–32)
Calcium: 8.9 mg/dL (ref 8.9–10.3)
Chloride: 100 mmol/L (ref 98–111)
Creatinine, Ser: 0.97 mg/dL (ref 0.61–1.24)
GFR calc Af Amer: >60 mL/min (ref >60)
GFR calc non Af Amer: >60 mL/min (ref >60)
Glucose, Bld: 80 mg/dL (ref 70–99)
Potassium: 4.2 mmol/L (ref 3.5–5.1)
Sodium: 138 mmol/L (ref 135–145)
Total Bilirubin: 0.9 mg/dL (ref 0.3–1.2)
Total Protein: 6.6 g/dL (ref 6.5–8.1)

## 2020-02-29 LAB — PHOSPHORUS: Phosphorus: 3.9 mg/dL (ref 2.5–4.6)

## 2020-02-29 LAB — HEPARIN LEVEL (UNFRACTIONATED)
Heparin Unfractionated: 1.14 IU/mL — ABNORMAL HIGH (ref 0.30–0.70)
Heparin Unfractionated: 0.3 IU/mL (ref 0.30–0.70)
Heparin Unfractionated: 0.44 IU/mL (ref 0.30–0.70)

## 2020-02-29 LAB — FIBRIN DERIVATIVES D-DIMER (ARMC ONLY): Fibrin derivatives D-dimer (ARMC): 6967.95 ng/mL (FEU) — ABNORMAL HIGH (ref 0.00–499.00)

## 2020-02-29 LAB — MAGNESIUM: Magnesium: 2.2 mg/dL (ref 1.7–2.4)

## 2020-02-29 LAB — FERRITIN: Ferritin: 617 ng/mL — ABNORMAL HIGH (ref 24–336)

## 2020-02-29 LAB — C-REACTIVE PROTEIN: CRP: 8.4 mg/dL — ABNORMAL HIGH (ref <1.0)

## 2020-02-29 MED ORDER — HEPARIN (PORCINE) 25000 UT/250ML-% IV SOLN
1350.0000 [IU]/h | INTRAVENOUS | Status: AC
  Administered 2020-02-29: 1350 [IU]/h via INTRAVENOUS
  Administered 2020-02-29: 1300 [IU]/h via INTRAVENOUS
  Filled 2020-02-29: qty 250

## 2020-02-29 NOTE — Progress Notes (Signed)
[facility]  PROGRESS NOTE  JYQUAN KENLEY QIW:[id number] DOB: Nov 09, 1950 DOA: 02/26/2020 PCP: Sofie Hartigan, MD   Brief HPI:   69 year old male with medical history of Parkinson disease, Lewy body dementia who was sent to ED by PCP for evaluation of shortness of breath.  Patient was diagnosed with COVID-19 viral infection 2 weeks ago he was tested positive on 02/13/2020 and was treated with short course of Augmentin, prednisone as well as bronchodilator therapy.  Patient symptomatically improved for past couple of days he developed worsening shortness of breath with minimal exertion.  Also complained of chest pain which was midsternal.  CTA showed extensive bilateral pulmonary emboli, moderate diffuse bilateral prominent groundglass appearing infiltrates. Patient started on IV heparin, remdesivir, Decadron.   Subjective   Patient seen and examined, denies shortness of breath or chest pain.  Currently requiring 2 L/min of oxygen via nasal cannula.   Assessment/Plan:     1. Acute pulmonary embolism-likely from underlying COVID-19 infection, CTA showed bilateral PE with no right heart strain.  Patient started on heparin drip.  Echocardiogram showed normal RV function.  No right heart strain.  Normal pulmonary pressure.  2. Pneumonia due to COVID-19 infection-patient was tested positive for COVID-19 on 02/13/2020.  He was treated with steroid taper, antibiotics and bronchodilator therapy as outpatient.  In the hospital he has been started on remdesivir per pharmacy protocol.  Also started on Decadron 6 mg IV daily.  Continue oxygen supplementation as needed.  He is currently not requiring oxygen.  CRP is 11.3.  CRP is now down to 9.4.  Follow CRP in a.m. 3. Acute kidney injury-likely in setting of diuresis with IV Lasix started yesterday.  Lasix was held.  Patient's creatinine went up to 1.49.  Today it is down to 0.97. 4. 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  Continue buspirone, Namenda, Lexapro, Zoloft, Aricept, Seroquel 5. CAD-continue aspirin, statin, fish oil. 6. Bronchitis-patient started on Zithromax 500 mg daily for 5 days. 7. Parkinson disease-continue Sinemet     COVID-19 Labs  Recent Labs    02/27/20 1250 02/28/20 0123 02/28/20 0655 02/29/20 0544  FERRITIN 532* 775*  --  617*  CRP 11.3*  --  9.4*  --     Lab Results  Component Value Date   SARSCOV2NAA NEGATIVE 04/15/2019     Scheduled medications:   . vitamin C  500 mg Oral Daily  . aspirin EC  81 mg Oral Daily  . atorvastatin  10 mg Oral Daily  . azithromycin  500 mg Oral Daily  . busPIRone  30 mg Oral QHS  . Carbidopa-Levodopa ER  1 tablet Oral BID  . dexamethasone (DECADRON) injection  6 mg Intravenous Q24H  . donepezil  10 mg Oral QAC breakfast  . escitalopram  20 mg Oral Daily  . loratadine  10 mg Oral Daily  . memantine  10 mg Oral Daily  . multivitamin with minerals  1 tablet Oral Daily  . omega-3 acid ethyl esters  1 capsule Oral Daily  . pantoprazole  40 mg Oral BID  . QUEtiapine  25 mg Oral Daily  . sertraline  100 mg Oral BID  . sodium chloride flush  3 mL Intravenous Q12H  . vitamin B-12  1,000 mcg Oral Daily  . zinc sulfate  220 mg Oral Daily    CBG: No results for input(s): GLUCAP in the last 168 hours.  SpO2: 96 % O2 Flow Rate (L/min): 2 L/min  CBC: Recent Labs  Lab 02/26/20 1050 02/27/20 1250 02/28/20 0123 02/29/20 0544  WBC 11.6* 15.2* 11.4* 8.9  NEUTROABS 10.1* 13.6* 10.5* 6.0  HGB 11.0* 10.9* 11.9* 11.3*  HCT 31.9* 32.2* 34.3* 33.4*  MCV 88.9 88.7 89.1 88.6  PLT 287 307 200 465    Basic Metabolic Panel: Recent Labs  Lab 02/26/20 1050 02/27/20 1250 02/28/20 0123 02/29/20 0544  NA 138 138 134* 138  K 4.4 3.7 4.7 4.2  CL 106 105 102 100  CO2 23 21* 21* 26  GLUCOSE 111* 115* 138* 80  BUN 14 20 42* 22  CREATININE 0.91 0.82 1.49* 0.97  CALCIUM 8.2* 8.3* 8.6* 8.9  MG  --  2.0 2.4 2.2   PHOS  --  2.5 3.2 3.9     Liver Function Tests: Recent Labs  Lab 02/26/20 1050 02/27/20 1250 02/28/20 0123 02/29/20 0544  AST 22 25 80* 26  ALT 11 11 64* 14  ALKPHOS 50 52 50 50  BILITOT 0.9 0.7 0.8 0.9  PROT 6.5 6.6 6.0* 6.6  ALBUMIN 2.8* 2.8* 2.8* 3.0*     Antibiotics: Anti-infectives (From admission, onward)   Start     Dose/Rate Route Frequency Ordered Stop   02/27/20 1000  remdesivir 100 mg in sodium chloride 0.9 % 100 mL IVPB       "Followed by" Linked Group Details   100 mg 200 mL/hr over 30 Minutes Intravenous Daily 02/26/20 1612 03/02/20 0959   02/26/20 2200  azithromycin (ZITHROMAX) tablet 500 mg        500 mg Oral Daily 02/26/20 1612     02/26/20 1830  remdesivir 200 mg in sodium chloride 0.9% 250 mL IVPB       "Followed by" Linked Group Details   200 mg 580 mL/hr over 30 Minutes Intravenous Once 02/26/20 1612 02/27/20 1536       DVT prophylaxis: Heparin  Code Status: Full code  Family Communication: Spoke to patient's wife on phone.    Status is: Inpatient  Dispo: The patient is from: Home              Anticipated d/c is to: Home              Anticipated d/c date is: 03/03/2020              Patient currently not medically stable for discharge  Barrier to discharge-pulmonary embolism, COVID-19 pneumonia       Consultants:    Procedures:   Objective   Vitals:   02/29/20 0045 02/29/20 0346 02/29/20 0928 02/29/20 1218  BP: 97/61 113/70 (!) 135/101 97/70  Pulse: (!) 52 (!) 54 (!) 102 64  Resp: 16 20 18 20   Temp: (!) 97.5 F (36.4 C) 97.7 F (36.5 C) 97.6 F (36.4 C) (!) 97.5 F (36.4 C)  TempSrc: Oral Oral Oral Oral  SpO2: 96% 97%  96%  Weight:      Height:        Intake/Output Summary (Last 24 hours) at 02/29/2020 1403 Last data filed at 02/29/2020 1218 Gross per 24 hour  Intake --  Output 1800 ml  Net -1800 ml    08/20 1901 - 08/22 0700 In: -  Out: 3500 [Urine:3500]  Filed Weights   02/26/20 1135  Weight: 72.6 kg     Physical Examination:   General-appears in no acute distress  Heart-S1-S2, regular, no murmur auscultated  Lungs-clear to auscultation bilaterally, no wheezing or crackles auscultated  Abdomen-soft, nontender, no organomegaly  Extremities-no edema in the lower extremities  Neuro-alert, oriented x3, no focal deficit noted    Data Reviewed:   Recent Results (from the past 240 hour(s))  Blood culture (routine x 2)     Status: None (Preliminary result)   Collection Time: 02/26/20 11:18 AM   Specimen: BLOOD  Result Value Ref Range Status   Specimen Description BLOOD RIGHT ANTECUBITAL  Final   Special Requests   Final    BOTTLES DRAWN AEROBIC AND ANAEROBIC Blood Culture adequate volume   Culture   Final    NO GROWTH 3 DAYS Performed at [facility], [address] [place], [place] [address]    Report Status PENDING  Incomplete  Blood culture (routine x 2)     Status: None (Preliminary result)   Collection Time: 02/26/20 11:23 AM   Specimen: BLOOD  Result Value Ref Range Status   Specimen Description BLOOD RIGHT HAND  Final   Special Requests   Final    BOTTLES DRAWN AEROBIC AND ANAEROBIC Blood Culture adequate volume   Culture   Final    NO GROWTH 3 DAYS Performed at [facility], [address] [place], [place] [address]    Report Status PENDING  Incomplete    No results for input(s): LIPASE, AMYLASE in the last 168 hours. No results for input(s): AMMONIA in the last 168 hours.  Cardiac Enzymes: No results for input(s): CKTOTAL, CKMB, CKMBINDEX, TROPONINI in the last 168 hours. BNP (last 3 results) Recent Labs    02/26/20 1050  BNP 214.3*    ProBNP (last 3 results) No results for input(s): PROBNP in the last 8760 hours.  Studies:  ECHOCARDIOGRAM COMPLETE  Result Date: 02/28/2020    ECHOCARDIOGRAM REPORT   Patient Name:   Darren Wang Date of Exam: 02/28/2020 Medical Rec #:  [id number]      Height:       68.0 in Accession #:     [id number]     Weight:       160.0 lb Date of Birth:  [birth year]      BSA:          1.859 m Patient Age:    62 years       BP:           101/68 mmHg Patient Gender: M              HR:           55 bpm. Exam Location:  [facility] Procedure: 2D Echo, Cardiac Doppler and Color Doppler Indications:     Chest pain 786.50  History:         Patient has no prior history of Echocardiogram examinations.                  Previous Myocardial Infarction; TIA.  Sonographer:     Sherrie Sport RDCS (AE) Referring Phys:  [place] Diagnosing Phys: Kathlyn Sacramento MD IMPRESSIONS  1. Left ventricular ejection fraction, by estimation, is 60 to 65%. The left ventricle has normal function. The left ventricle has no regional wall motion abnormalities. There is mild left ventricular hypertrophy. Left ventricular diastolic parameters were normal.  2. Right ventricular systolic function is normal. The right ventricular size is normal. There is normal pulmonary artery systolic pressure. The estimated right ventricular systolic pressure is 78.2 mmHg.  3. The mitral valve is normal in structure. No evidence of mitral valve regurgitation. No evidence of mitral stenosis.  4. The aortic  valve is normal in structure. Aortic valve regurgitation is not visualized. Mild aortic valve sclerosis is present, with no evidence of aortic valve stenosis. FINDINGS  Left Ventricle: Left ventricular ejection fraction, by estimation, is 60 to 65%. The left ventricle has normal function. The left ventricle has no regional wall motion abnormalities. The left ventricular internal cavity size was normal in size. There is  mild left ventricular hypertrophy. Left ventricular diastolic parameters were normal. Right Ventricle: The right ventricular size is normal. No increase in right ventricular wall thickness. Right ventricular systolic function is normal. There is normal pulmonary artery systolic pressure. The tricuspid regurgitant velocity is 2.70 m/s, and  with  an assumed right atrial pressure of 5 mmHg, the estimated right ventricular systolic pressure is 16.0 mmHg. Left Atrium: Left atrial size was normal in size. Right Atrium: Right atrial size was normal in size. Pericardium: There is no evidence of pericardial effusion. Mitral Valve: The mitral valve is normal in structure. Normal mobility of the mitral valve leaflets. No evidence of mitral valve regurgitation. No evidence of mitral valve stenosis. Tricuspid Valve: The tricuspid valve is normal in structure. Tricuspid valve regurgitation is mild . No evidence of tricuspid stenosis. Aortic Valve: The aortic valve is normal in structure. Aortic valve regurgitation is not visualized. Mild aortic valve sclerosis is present, with no evidence of aortic valve stenosis. Aortic valve mean gradient measures 5.0 mmHg. Aortic valve peak gradient measures 8.1 mmHg. Aortic valve area, by VTI measures 2.56 cm. Pulmonic Valve: The pulmonic valve was normal in structure. Pulmonic valve regurgitation is not visualized. No evidence of pulmonic stenosis. Aorta: The aortic root is normal in size and structure. Venous: The inferior vena cava was not well visualized. IAS/Shunts: No atrial level shunt detected by color flow Doppler.  LEFT VENTRICLE PLAX 2D LVIDd:         4.02 cm  Diastology LVIDs:         2.49 cm  LV e' lateral:   9.79 cm/s LV PW:         1.14 cm  LV E/e' lateral: 8.9 LV IVS:        1.22 cm  LV e' medial:    6.85 cm/s LVOT diam:     2.00 cm  LV E/e' medial:  12.7 LV SV:         75 LV SV Index:   40 LVOT Area:     3.14 cm  RIGHT VENTRICLE RV S prime:     13.80 cm/s TAPSE (M-mode): 4.6 cm LEFT ATRIUM           Index       RIGHT ATRIUM           Index LA diam:      3.10 cm 1.67 cm/m  RA Area:     18.90 cm LA Vol (A2C): 55.3 ml 29.75 ml/m RA Volume:   55.40 ml  29.80 ml/m LA Vol (A4C): 37.5 ml 20.17 ml/m  AORTIC VALVE                    PULMONIC VALVE AV Area (Vmax):    2.30 cm     PV Vmax:        0.91 m/s AV Area  (Vmean):   2.40 cm     PV Peak grad:   3.3 mmHg AV Area (VTI):     2.56 cm     RVOT Peak grad: 4 mmHg AV Vmax:  142.00 cm/s AV Vmean:          100.000 cm/s AV VTI:            0.293 m AV Peak Grad:      8.1 mmHg AV Mean Grad:      5.0 mmHg LVOT Vmax:         104.00 cm/s LVOT Vmean:        76.500 cm/s LVOT VTI:          0.239 m LVOT/AV VTI ratio: 0.82  AORTA Ao Root diam: 3.30 cm MITRAL VALVE               TRICUSPID VALVE MV Area (PHT): 2.04 cm    TR Peak grad:   29.2 mmHg MV Decel Time: 372 msec    TR Vmax:        270.00 cm/s MV E velocity: 86.80 cm/s MV A velocity: 55.70 cm/s  SHUNTS MV E/A ratio:  1.56        Systemic VTI:  0.24 m                            Systemic Diam: 2.00 cm Kathlyn Sacramento MD Electronically signed by Kathlyn Sacramento MD Signature Date/Time: 02/28/2020/11:47:07 AM    Final        Oswald Hillock   [facility] If 7PM-7AM, please contact night-coverage at [email or web address], Office  [phone]   02/29/2020, 2:03 PM  LOS: 3 days

## 2020-02-29 NOTE — Plan of Care (Signed)

## 2020-02-29 NOTE — Consult Note (Signed)
ANTICOAGULATION CONSULT NOTE  Pharmacy Consult for Heparin  Indication: Pulmonary Embolism  Allergies  Allergen Reactions  . Benadryl [Diphenhydramine]   . Neosporin [Bacitracin-Polymyxin B]     Patient Measurements: Height: 5\' 8"  (172.7 cm) Weight: 72.6 kg (160 lb) IBW/kg (Calculated) : 68.4 Heparin Dosing Weight: 72.6 kg   Vital Signs: Temp: 97.6 F (36.4 C) (08/22 0928) Temp Source: Oral (08/22 0928) BP: 135/101 (08/22 0928) Pulse Rate: 102 (08/22 0928)  Labs: Recent Labs    02/26/20 1815 02/27/20 0443 02/27/20 1250 02/27/20 1749 02/28/20 0123 02/28/20 0953 02/28/20 1558 02/28/20 2252 02/29/20 0544 02/29/20 1110  HGB  --    < > 10.9*  --  11.9*  --   --   --  11.3*  --   HCT  --   --  32.2*  --  34.3*  --   --   --  33.4*  --   PLT  --   --  307  --  200  --   --   --  331  --   APTT 29  --   --   --   --   --   --   --   --   --   LABPROT 14.3  --   --   --   --   --   --   --   --   --   INR 1.2  --   --   --   --   --   --   --   --   --   HEPARINUNFRC  --    < > 0.24*   < > <0.10*   < > 0.76* 1.14*  --  0.30  CREATININE  --   --  0.82  --  1.49*  --   --   --  0.97  --    < > = values in this interval not displayed.    Estimated Creatinine Clearance: 71.5 mL/min (by C-G formula based on SCr of 0.97 mg/dL).   Medications:  Confirmed no PTA anticoagulants   Assessment: Pharmacy consulted for heparin dosing in a patient who was diagnosed with COVID 19 ~ 2 weeks ago and is currently being treated for the virus. No baseline d-dimers presently.   Date and time Level Plan  8/19 2139 n/a Initial rate 1150 units/hr  8/20 0443 0.16, subtherapeutic Rate  increased to 1350 units/hr  8/20 1250 0.24, subtherapeutic No change, drip interrupted  8/21 0953 0.62 therapeutic  No chg  8/21 1640 0.76 supratherapeutic  -----  8/21 2252 1.14 Hold x 1 hr, decrease to 1300 units/hr  8/22 1110 0.30 Increase to 1350 units/hr    Goal of Therapy:  Heparin level 0.3-0.7  units/ml Monitor platelets by anticoagulation protocol: Yes   Plan:  --0822 at 1110 HL = 0.30 barely therapeutic, Will increase rate to 1350 units/hr , will recheck HL 6 hours  --Continue to monitor H&H and platelets   Darren Wang A 02/29/2020,11:41 AM

## 2020-02-29 NOTE — Consult Note (Signed)
ANTICOAGULATION CONSULT NOTE  Pharmacy Consult for Heparin  Indication: Pulmonary Embolism  Allergies  Allergen Reactions  . Benadryl [Diphenhydramine]   . Neosporin [Bacitracin-Polymyxin B]     Patient Measurements: Height: 5\' 8"  (172.7 cm) Weight: 72.6 kg (160 lb) IBW/kg (Calculated) : 68.4 Heparin Dosing Weight: 72.6 kg   Vital Signs: Temp: 97.5 F (36.4 C) (08/22 0045) Temp Source: Oral (08/22 0045) BP: 97/61 (08/22 0045) Pulse Rate: 52 (08/22 0045)  Labs: Recent Labs     0000 02/26/20 1050 02/26/20 1815 02/27/20 0443 02/27/20 1250 02/27/20 1749 02/28/20 0123 02/28/20 0123 02/28/20 0953 02/28/20 1558 02/28/20 2252  HGB   < > 11.0*  --   --  10.9*  --  11.9*  --   --   --   --   HCT  --  31.9*  --   --  32.2*  --  34.3*  --   --   --   --   PLT  --  287  --   --  307  --  200  --   --   --   --   APTT  --   --  29  --   --   --   --   --   --   --   --   LABPROT  --   --  14.3  --   --   --   --   --   --   --   --   INR  --   --  1.2  --   --   --   --   --   --   --   --   HEPARINUNFRC  --   --   --    < > 0.24*   < > <0.10*   < > 0.62 0.76* 1.14*  CREATININE  --  0.91  --   --  0.82  --  1.49*  --   --   --   --   TROPONINIHS  --  5  --   --   --   --   --   --   --   --   --    < > = values in this interval not displayed.    Estimated Creatinine Clearance: 46.5 mL/min (A) (by C-G formula based on SCr of 1.49 mg/dL (H)).   Medications:  Confirmed no PTA anticoagulants   Assessment: Pharmacy consulted for heparin dosing in a patient who was diagnosed with COVID 19 ~ 2 weeks ago and is currently being treated for the virus. No baseline d-dimers presently.   Date and time Level Plan  8/19 2139 n/a Initial rate 1150 units/hr  8/20 0443 0.16, subtherapeutic Rate  increased to 1350 units/hr  8/20 1250 0.24, subtherapeutic No change, drip interrupted  8/21 0953 0.62 therapeutic  No chg  8/21 1640 0.76 supratherapeutic  -----    Goal of Therapy:   Heparin level 0.3-0.7 units/ml Monitor platelets by anticoagulation protocol: Yes   Plan:  --0821 at 2252 HL = 1.14, trended up despite rate decrease. D/W nurse, will hold heparin x 1 hr then REDUCE rate to 1300 units/hr - notified nursing of changes, will recheck HL 6 hours after heparin rate reduced.  --Continue to monitor H&H and platelets   [place] Crane, Tamina Cyphers A 02/29/2020,2:48 AM

## 2020-02-29 NOTE — Consult Note (Addendum)
ANTICOAGULATION CONSULT NOTE  Pharmacy Consult for Heparin  Indication: Pulmonary Embolism  Allergies  Allergen Reactions  . Benadryl [Diphenhydramine]   . Neosporin [Bacitracin-Polymyxin B]     Patient Measurements: Height: 5\' 8"  (172.7 cm) Weight: 72.6 kg (160 lb) IBW/kg (Calculated) : 68.4 Heparin Dosing Weight: 72.6 kg   Vital Signs: Temp: 97.9 F (36.6 C) (08/22 1702) Temp Source: Oral (08/22 1702) BP: 105/67 (08/22 1702) Pulse Rate: 66 (08/22 1702)  Labs: Recent Labs    02/27/20 1250 02/27/20 1749 02/28/20 0123 02/28/20 0953 02/28/20 2252 02/29/20 0544 02/29/20 1110 02/29/20 1740  HGB 10.9*  --  11.9*  --   --  11.3*  --   --   HCT 32.2*  --  34.3*  --   --  33.4*  --   --   PLT 307  --  200  --   --  331  --   --   HEPARINUNFRC 0.24*   < > <0.10*   < > 1.14*  --  0.30 0.44  CREATININE 0.82  --  1.49*  --   --  0.97  --   --    < > = values in this interval not displayed.    Estimated Creatinine Clearance: 71.5 mL/min (by C-G formula based on SCr of 0.97 mg/dL).   Medications:  Confirmed no PTA anticoagulants   Assessment: Pharmacy consulted for heparin dosing in a patient who was diagnosed with COVID 19 ~ 2 weeks ago and is currently being treated for the virus. No baseline d-dimers presently.   Date and time Level Plan  8/19 2139 n/a Initial rate 1150 units/hr  8/20 0443 0.16, subtherapeutic Rate  increased to 1350 units/hr  8/20 1250 0.24, subtherapeutic No change, drip interrupted  8/21 0953 0.62 therapeutic  No change  8/21 1640 0.76 supratherapeutic  -----  8/21 2252 1.14 Decrease to 1300 units/hr  8/22 1110 0.30, therapeutic Increase to 1350 units/hr  8/22 1740 0.44, therapeutic No change    Goal of Therapy:  Heparin level 0.3-0.7 units/ml Monitor platelets by anticoagulation protocol: Yes   Plan:  --6808 at 1740 HL = 0.44, therapeutic. Continue heparin drip at rate of 1350 units/hr --Re-check confirmatory level in 6 hours  --Continue  to monitor H&H and platelets  Benita Gutter 02/29/2020,6:35 PM

## 2020-03-01 LAB — FIBRIN DERIVATIVES D-DIMER (ARMC ONLY): Fibrin derivatives D-dimer (ARMC): 5990.92 ng/mL (FEU) — ABNORMAL HIGH (ref 0.00–499.00)

## 2020-03-01 LAB — CBC WITH DIFFERENTIAL/PLATELET
Abs Immature Granulocytes: 0.11 10*3/uL — ABNORMAL HIGH (ref 0.00–0.07)
Basophils Absolute: 0 10*3/uL (ref 0.0–0.1)
Basophils Relative: 1 %
Eosinophils Absolute: 0.2 10*3/uL (ref 0.0–0.5)
Eosinophils Relative: 2 %
HCT: 36.2 % — ABNORMAL LOW (ref 39.0–52.0)
Hemoglobin: 11.9 g/dL — ABNORMAL LOW (ref 13.0–17.0)
Immature Granulocytes: 1 %
Lymphocytes Relative: 17 %
Lymphs Abs: 1.4 10*3/uL (ref 0.7–4.0)
MCH: 29.3 pg (ref 26.0–34.0)
MCHC: 32.9 g/dL (ref 30.0–36.0)
MCV: 89.2 fL (ref 80.0–100.0)
Monocytes Absolute: 0.6 10*3/uL (ref 0.1–1.0)
Monocytes Relative: 8 %
Neutro Abs: 6 10*3/uL (ref 1.7–7.7)
Neutrophils Relative %: 71 %
Platelets: 340 10*3/uL (ref 150–400)
RBC: 4.06 MIL/uL — ABNORMAL LOW (ref 4.22–5.81)
RDW: 13.9 % (ref 11.5–15.5)
WBC: 8.4 10*3/uL (ref 4.0–10.5)
nRBC: 0 % (ref 0.0–0.2)

## 2020-03-01 LAB — COMPREHENSIVE METABOLIC PANEL
ALT: 12 U/L (ref 0–44)
AST: 25 U/L (ref 15–41)
Albumin: 3 g/dL — ABNORMAL LOW (ref 3.5–5.0)
Alkaline Phosphatase: 52 U/L (ref 38–126)
Anion gap: 10 (ref 5–15)
BUN: 20 mg/dL (ref 8–23)
CO2: 27 mmol/L (ref 22–32)
Calcium: 8.7 mg/dL — ABNORMAL LOW (ref 8.9–10.3)
Chloride: 100 mmol/L (ref 98–111)
Creatinine, Ser: 1.04 mg/dL (ref 0.61–1.24)
GFR calc Af Amer: >60 mL/min (ref >60)
GFR calc non Af Amer: >60 mL/min (ref >60)
Glucose, Bld: 93 mg/dL (ref 70–99)
Potassium: 4.6 mmol/L (ref 3.5–5.1)
Sodium: 137 mmol/L (ref 135–145)
Total Bilirubin: 1 mg/dL (ref 0.3–1.2)
Total Protein: 6.5 g/dL (ref 6.5–8.1)

## 2020-03-01 LAB — MAGNESIUM: Magnesium: 2.2 mg/dL (ref 1.7–2.4)

## 2020-03-01 LAB — HEPARIN LEVEL (UNFRACTIONATED)
Heparin Unfractionated: 0.62 IU/mL (ref 0.30–0.70)
Heparin Unfractionated: 0.63 IU/mL (ref 0.30–0.70)

## 2020-03-01 LAB — FERRITIN: Ferritin: 606 ng/mL — ABNORMAL HIGH (ref 24–336)

## 2020-03-01 LAB — C-REACTIVE PROTEIN: CRP: 7 mg/dL — ABNORMAL HIGH (ref <1.0)

## 2020-03-01 LAB — PHOSPHORUS: Phosphorus: 3.8 mg/dL (ref 2.5–4.6)

## 2020-03-01 MED ORDER — RIVAROXABAN 15 MG PO TABS
15.0000 mg | ORAL_TABLET | Freq: Two times a day (BID) | ORAL | Status: DC
  Administered 2020-03-01 – 2020-03-02 (×2): 15 mg via ORAL
  Filled 2020-03-01 (×3): qty 1

## 2020-03-01 MED ORDER — RIVAROXABAN 20 MG PO TABS: 20.0000 mg | ORAL_TABLET | Freq: Every day | ORAL | Status: DC

## 2020-03-01 NOTE — Progress Notes (Signed)
[facility]  PROGRESS NOTE  Darren Wang YNW:[id number] DOB: 06/03/1951 DOA: 02/26/2020 PCP: Sofie Hartigan, MD   Brief HPI:   69 year old male with medical history of Parkinson disease, Lewy body dementia who was sent to ED by PCP for evaluation of shortness of breath.  Patient was diagnosed with COVID-19 viral infection 2 weeks ago he was tested positive on 02/13/2020 and was treated with short course of Augmentin, prednisone as well as bronchodilator therapy.  Patient symptomatically improved for past couple of days he developed worsening shortness of breath with minimal exertion.  Also complained of chest pain which was midsternal.  CTA showed extensive bilateral pulmonary emboli, moderate diffuse bilateral prominent groundglass appearing infiltrates. Patient started on IV heparin, remdesivir, Decadron.   Subjective   Patient seen and examined, denies any complaints.  CRP is down to 7.0.  Still requiring 2 L/min of oxygen via nasal cannula.   Assessment/Plan:     1. Acute pulmonary embolism-likely from underlying COVID-19 infection, CTA showed bilateral PE with no right heart strain.  Patient started on heparin drip.  Echocardiogram showed normal RV function.  No right heart strain.  Normal pulmonary pressure.  Will discontinue IV heparin and start Xarelto per pharmacy. 2. Pneumonia due to COVID-19 infection-patient was tested positive for COVID-19 on 02/13/2020.  He was treated with steroid taper, antibiotics and bronchodilator therapy as outpatient.  In the hospital he has been started on remdesivir per pharmacy protocol.  Also started on Decadron 6 mg IV daily.  Continue oxygen supplementation as needed.  He is currently not requiring oxygen.  CRP on admission was 11.3 and is now down to 7.0.  Follow CRP in a.m. 3. Acute kidney injury-likely in setting of diuresis with IV Lasix started yesterday.  Lasix was held.  Patient's creatinine went up to 1.49.  Today it is down to  0.97. 4. 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  Continue buspirone, Namenda, Lexapro, Zoloft, Aricept, Seroquel 5. CAD-continue aspirin, statin, fish oil. 6. Bronchitis-patient started on Zithromax 500 mg daily for 5 days. 7. Parkinson disease-continue Sinemet     COVID-19 Labs  Recent Labs    02/28/20 0123 02/28/20 0655 02/29/20 0544 03/01/20 0624  FERRITIN 775*  --  617* 606*  CRP  --  9.4* 8.4* 7.0*    Lab Results  Component Value Date   SARSCOV2NAA NEGATIVE 04/15/2019     Scheduled medications:   . vitamin C  500 mg Oral Daily  . aspirin EC  81 mg Oral Daily  . atorvastatin  10 mg Oral Daily  . azithromycin  500 mg Oral Daily  . busPIRone  30 mg Oral QHS  . Carbidopa-Levodopa ER  1 tablet Oral BID  . dexamethasone (DECADRON) injection  6 mg Intravenous Q24H  . donepezil  10 mg Oral QAC breakfast  . escitalopram  20 mg Oral Daily  . loratadine  10 mg Oral Daily  . memantine  10 mg Oral Daily  . multivitamin with minerals  1 tablet Oral Daily  . omega-3 acid ethyl esters  1 capsule Oral Daily  . pantoprazole  40 mg Oral BID  . QUEtiapine  25 mg Oral Daily  . Rivaroxaban  15 mg Oral BID WC   Followed by  . [START ON 03/22/2020] rivaroxaban  20 mg Oral Q supper  . sertraline  100 mg Oral BID  . sodium chloride flush  3 mL Intravenous Q12H  . vitamin B-12  1,000 mcg Oral  Daily  . zinc sulfate  220 mg Oral Daily    CBG: No results for input(s): GLUCAP in the last 168 hours.  SpO2: 96 % O2 Flow Rate (L/min): 2 L/min    CBC: Recent Labs  Lab 02/26/20 1050 02/27/20 1250 02/28/20 0123 02/29/20 0544 03/01/20 0624  WBC 11.6* 15.2* 11.4* 8.9 8.4  NEUTROABS 10.1* 13.6* 10.5* 6.0 6.0  HGB 11.0* 10.9* 11.9* 11.3* 11.9*  HCT 31.9* 32.2* 34.3* 33.4* 36.2*  MCV 88.9 88.7 89.1 88.6 89.2  PLT 287 307 200 331 545    Basic Metabolic Panel: Recent Labs  Lab 02/26/20 1050 02/27/20 1250 02/28/20 0123  02/29/20 0544 03/01/20 0624  NA 138 138 134* 138 137  K 4.4 3.7 4.7 4.2 4.6  CL 106 105 102 100 100  CO2 23 21* 21* 26 27  GLUCOSE 111* 115* 138* 80 93  BUN 14 20 42* 22 20  CREATININE 0.91 0.82 1.49* 0.97 1.04  CALCIUM 8.2* 8.3* 8.6* 8.9 8.7*  MG  --  2.0 2.4 2.2 2.2  PHOS  --  2.5 3.2 3.9 3.8     Liver Function Tests: Recent Labs  Lab 02/26/20 1050 02/27/20 1250 02/28/20 0123 02/29/20 0544 03/01/20 0624  AST 22 25 80* 26 25  ALT 11 11 64* 14 12  ALKPHOS 50 52 50 50 52  BILITOT 0.9 0.7 0.8 0.9 1.0  PROT 6.5 6.6 6.0* 6.6 6.5  ALBUMIN 2.8* 2.8* 2.8* 3.0* 3.0*     Antibiotics: Anti-infectives (From admission, onward)   Start     Dose/Rate Route Frequency Ordered Stop   02/27/20 1000  remdesivir 100 mg in sodium chloride 0.9 % 100 mL IVPB       "Followed by" Linked Group Details   100 mg 200 mL/hr over 30 Minutes Intravenous Daily 02/26/20 1612 03/01/20 0945   02/26/20 2200  azithromycin (ZITHROMAX) tablet 500 mg        500 mg Oral Daily 02/26/20 1612     02/26/20 1830  remdesivir 200 mg in sodium chloride 0.9% 250 mL IVPB       "Followed by" Linked Group Details   200 mg 580 mL/hr over 30 Minutes Intravenous Once 02/26/20 1612 02/27/20 1536       DVT prophylaxis: Heparin  Code Status: Full code  Family Communication: Spoke to patient's wife on phone.    Status is: Inpatient  Dispo: The patient is from: Home              Anticipated d/c is to: Home              Anticipated d/c date is: 03/03/2020              Patient currently not medically stable for discharge  Barrier to discharge-pulmonary embolism, COVID-19 pneumonia       Consultants:    Procedures:   Objective   Vitals:   02/29/20 2357 03/01/20 0429 03/01/20 0804 03/01/20 1224  BP: 93/69 (!) 95/57 100/72 101/64  Pulse: 63 60 61 71  Resp:  20 16 18   Temp: 97.7 F (36.5 C) 97.8 F (36.6 C) 97.9 F (36.6 C) 97.7 F (36.5 C)  TempSrc: Oral Oral Oral Oral  SpO2: 96% 97% 96% 96%   Weight:      Height:        Intake/Output Summary (Last 24 hours) at 03/01/2020 1450 Last data filed at 03/01/2020 0804 Gross per 24 hour  Intake 300 ml  Output 900 ml  Net -  600 ml    08/21 1901 - 08/23 0700 In: 300  Out: 1500 [Urine:1500]  Filed Weights   02/26/20 1135  Weight: 72.6 kg    Physical Examination:   General-appears in no acute distress  Heart-S1-S2, regular, no murmur auscultated  Lungs-clear to auscultation bilaterally, no wheezing or crackles auscultated  Abdomen-soft, nontender, no organomegaly  Extremities-no edema in the lower extremities  Neuro-alert, oriented x3, no focal deficit noted    Data Reviewed:   Recent Results (from the past 240 hour(s))  Blood culture (routine x 2)     Status: None (Preliminary result)   Collection Time: 02/26/20 11:18 AM   Specimen: BLOOD  Result Value Ref Range Status   Specimen Description BLOOD RIGHT ANTECUBITAL  Final   Special Requests   Final    BOTTLES DRAWN AEROBIC AND ANAEROBIC Blood Culture adequate volume   Culture   Final    NO GROWTH 4 DAYS Performed at [facility], [address] [place], [place] [address]    Report Status PENDING  Incomplete  Blood culture (routine x 2)     Status: None (Preliminary result)   Collection Time: 02/26/20 11:23 AM   Specimen: BLOOD  Result Value Ref Range Status   Specimen Description BLOOD RIGHT HAND  Final   Special Requests   Final    BOTTLES DRAWN AEROBIC AND ANAEROBIC Blood Culture adequate volume   Culture   Final    NO GROWTH 4 DAYS Performed at [facility], [address] [place], [place] [address]    Report Status PENDING  Incomplete    No results for input(s): LIPASE, AMYLASE in the last 168 hours. No results for input(s): AMMONIA in the last 168 hours.  Cardiac Enzymes: No results for input(s): CKTOTAL, CKMB, CKMBINDEX, TROPONINI in the last 168 hours. BNP (last 3 results) Recent Labs    02/26/20 1050  BNP 214.3*     ProBNP (last 3 results) No results for input(s): PROBNP in the last 8760 hours.  Studies:  No results found.     Oswald Hillock   [facility] If 7PM-7AM, please contact night-coverage at [email or web address], Office  [phone]   03/01/2020, 2:50 PM  LOS: 4 days

## 2020-03-01 NOTE — Progress Notes (Signed)
Counseling on Rivaroxaban (Xarelto)  Counseled patient through phone due to contact precautions. Explained and discussed the following information on rivaroxaban (Xarelto) .  1) Explained the indication for use: PE treatment 2) Reviewed indication driven dose regimen 15 mg BID x21 days before switching to 20 mg daily in VTE treatment  3) Recommended taking doses >=15mg  with evening meal to improve absorption.  4) Recommended taking Xarelto doses 10mg  similar to hospital schedule (AM dosing).  5) Reviewed drug-diet instructions including limiting alcohol consumption. If crushed then mix with applesauce just prior to use. 6) Recommended to take a missed dose as soon as remembers on the same day. During BID dosing up to 2x 15mg  tablets may be taken together, then resume regimen next day.  7) Discussed importance of adherence: Stopping without other stroke or VTE prevention medication to take the place of Xarelto may increase your risk of developing a new clot or stroke. Refill your prescription before you run out. 8) Explained how to monitor for bleeding. Notify a HCP for unusual bruising, urine or stool color changes like red, dark brown or black, bleeding from injury or a nosebleed that does not stop, as well as if you have a serious fall or if you hit your head even if there is no bleeding.  9) Future dose adjustments by your HCP may be needed if kidney function changes or you add new medications  Benn Moulder, PharmD Pharmacy Resident  03/01/2020 4:15 PM

## 2020-03-01 NOTE — Plan of Care (Signed)

## 2020-03-01 NOTE — Consult Note (Addendum)
ANTICOAGULATION CONSULT NOTE  Pharmacy Consult for Heparin  Indication: Pulmonary Embolism  Allergies  Allergen Reactions  . Benadryl [Diphenhydramine]   . Neosporin [Bacitracin-Polymyxin B]     Patient Measurements: Height: 5\' 8"  (172.7 cm) Weight: 72.6 kg (160 lb) IBW/kg (Calculated) : 68.4 Heparin Dosing Weight: 72.6 kg   Vital Signs: Temp: 97.8 F (36.6 C) (08/23 0429) Temp Source: Oral (08/23 0429) BP: 95/57 (08/23 0429) Pulse Rate: 60 (08/23 0429)  Labs: Recent Labs    02/27/20 1250 02/27/20 1749 02/28/20 0123 02/28/20 0953 02/29/20 0544 02/29/20 1110 02/29/20 1740 02/29/20 2350 03/01/20 0624  HGB 10.9*  --  11.9*  --  11.3*  --   --   --  11.9*  HCT 32.2*  --  34.3*  --  33.4*  --   --   --  36.2*  PLT 307  --  200  --  331  --   --   --  340  HEPARINUNFRC 0.24*   < > <0.10*   < >  --    < > 0.44 0.62 0.63  CREATININE 0.82  --  1.49*  --  0.97  --   --   --   --    < > = values in this interval not displayed.    Estimated Creatinine Clearance: 71.5 mL/min (by C-G formula based on SCr of 0.97 mg/dL).   Medications:  Confirmed no PTA anticoagulants   Assessment: Pharmacy consulted for heparin dosing in a patient who was diagnosed with COVID 19 ~ 2 weeks ago and is currently being treated for the virus. No baseline d-dimers presently.   Date and time Level Plan  8/19 2139 n/a Initial rate 1150 units/hr  8/20 0443 0.16, subtherapeutic Rate  increased to 1350 units/hr  8/20 1250 0.24, subtherapeutic No change, drip interrupted  8/21 0953 0.62 therapeutic  No change  8/21 1640 0.76 supratherapeutic  -----  8/21 2252 1.14 Decrease to 1300 units/hr  8/22 1110 0.30, therapeutic Increase to 1350 units/hr  8/22 1740 0.44, therapeutic No change    Goal of Therapy:  Heparin level 0.3-0.7 units/ml Monitor platelets by anticoagulation protocol: Yes   Plan:  --0823 at 0624 HL = 0.63, therapeutic x 3. CBC stable.  Continue heparin drip at rate of 1350  units/hr --Re-check HL in am w/ CBC --Continue to monitor H&H and platelets  [place] Crane, Artez Regis A 03/01/2020,7:10 AM

## 2020-03-01 NOTE — Consult Note (Signed)
[place] for Xarelto (Rivaroxaban) Indication: Pulmonary Embolism  Allergies  Allergen Reactions  . Benadryl [Diphenhydramine]   . Neosporin [Bacitracin-Polymyxin B]     Patient Measurements: Height: 5\' 8"  (172.7 cm) Weight: 72.6 kg (160 lb) IBW/kg (Calculated) : 68.4 Heparin Dosing Weight: 72.6 kg   Vital Signs: Temp: 97.9 F (36.6 C) (08/23 0804) Temp Source: Oral (08/23 0804) BP: 100/72 (08/23 0804) Pulse Rate: 61 (08/23 0804)  Labs: Recent Labs    02/28/20 0123 02/28/20 0953 02/29/20 0544 02/29/20 1110 02/29/20 1740 02/29/20 2350 03/01/20 0624  HGB 11.9*  --  11.3*  --   --   --  11.9*  HCT 34.3*  --  33.4*  --   --   --  36.2*  PLT 200  --  331  --   --   --  340  HEPARINUNFRC <0.10*   < >  --    < > 0.44 0.62 0.63  CREATININE 1.49*  --  0.97  --   --   --  1.04   < > = values in this interval not displayed.    Estimated Creatinine Clearance: 66.7 mL/min (by C-G formula based on SCr of 1.04 mg/dL).   Medications:  Confirmed no PTA anticoagulants   Assessment: Pharmacy consulted for Xarelto (Rivaroxaban) dosing for 69 yo male admitted with COVID-19 infection and PE. Patient is currently being treated with heparin infusion.     Plan:  8/23 Heparin infusion to stop at 1700 this evening. At 1700 this evening will start Xarelto (Rivaroxaban) 15mg  BID with meals for 21 days, followed by Xarelto 20mg  daily thereafter. RN aware of plan.   --Continue to monitor H&H and platelets  Pernell Dupre, PharmD, BCPS Clinical Pharmacist 03/01/2020 11:10 AM

## 2020-03-01 NOTE — Consult Note (Signed)
ANTICOAGULATION CONSULT NOTE  Pharmacy Consult for Heparin  Indication: Pulmonary Embolism  Allergies  Allergen Reactions  . Benadryl [Diphenhydramine]   . Neosporin [Bacitracin-Polymyxin B]     Patient Measurements: Height: 5\' 8"  (172.7 cm) Weight: 72.6 kg (160 lb) IBW/kg (Calculated) : 68.4 Heparin Dosing Weight: 72.6 kg   Vital Signs: Temp: 97.7 F (36.5 C) (08/22 2357) Temp Source: Oral (08/22 2357) BP: 93/69 (08/22 2357) Pulse Rate: 63 (08/22 2357)  Labs: Recent Labs    02/27/20 1250 02/27/20 1749 02/28/20 0123 02/28/20 0953 02/28/20 2252 02/29/20 0544 02/29/20 1110 02/29/20 1740 02/29/20 2350  HGB 10.9*  --  11.9*  --   --  11.3*  --   --   --   HCT 32.2*  --  34.3*  --   --  33.4*  --   --   --   PLT 307  --  200  --   --  331  --   --   --   HEPARINUNFRC 0.24*   < > <0.10*   < >   < >  --  0.30 0.44 0.62  CREATININE 0.82  --  1.49*  --   --  0.97  --   --   --    < > = values in this interval not displayed.    Estimated Creatinine Clearance: 71.5 mL/min (by C-G formula based on SCr of 0.97 mg/dL).   Medications:  Confirmed no PTA anticoagulants   Assessment: Pharmacy consulted for heparin dosing in a patient who was diagnosed with COVID 19 ~ 2 weeks ago and is currently being treated for the virus. No baseline d-dimers presently.   Date and time Level Plan  8/19 2139 n/a Initial rate 1150 units/hr  8/20 0443 0.16, subtherapeutic Rate  increased to 1350 units/hr  8/20 1250 0.24, subtherapeutic No change, drip interrupted  8/21 0953 0.62 therapeutic  No change  8/21 1640 0.76 supratherapeutic  -----  8/21 2252 1.14 Decrease to 1300 units/hr  8/22 1110 0.30, therapeutic Increase to 1350 units/hr  8/22 1740 0.44, therapeutic No change    Goal of Therapy:  Heparin level 0.3-0.7 units/ml Monitor platelets by anticoagulation protocol: Yes   Plan:  --9381 at 2350 HL = 0.62, therapeutic x 2. Continue heparin drip at rate of 1350 units/hr --Re-check  HL in am w/ CBC --Continue to monitor H&H and platelets  [place] Crane, Gina Costilla A 03/01/2020,12:41 AM

## 2020-03-02 LAB — CBC WITH DIFFERENTIAL/PLATELET
Abs Immature Granulocytes: 0.11 10*3/uL — ABNORMAL HIGH (ref 0.00–0.07)
Basophils Absolute: 0 10*3/uL (ref 0.0–0.1)
Basophils Relative: 0 %
Eosinophils Absolute: 0.1 10*3/uL (ref 0.0–0.5)
Eosinophils Relative: 1 %
HCT: 34.3 % — ABNORMAL LOW (ref 39.0–52.0)
Hemoglobin: 11.4 g/dL — ABNORMAL LOW (ref 13.0–17.0)
Immature Granulocytes: 1 %
Lymphocytes Relative: 17 %
Lymphs Abs: 1.5 10*3/uL (ref 0.7–4.0)
MCH: 30.2 pg (ref 26.0–34.0)
MCHC: 33.2 g/dL (ref 30.0–36.0)
MCV: 91 fL (ref 80.0–100.0)
Monocytes Absolute: 0.7 10*3/uL (ref 0.1–1.0)
Monocytes Relative: 8 %
Neutro Abs: 6.6 10*3/uL (ref 1.7–7.7)
Neutrophils Relative %: 73 %
Platelets: 355 10*3/uL (ref 150–400)
RBC: 3.77 MIL/uL — ABNORMAL LOW (ref 4.22–5.81)
RDW: 13.7 % (ref 11.5–15.5)
WBC: 9 10*3/uL (ref 4.0–10.5)
nRBC: 0 % (ref 0.0–0.2)

## 2020-03-02 LAB — COMPREHENSIVE METABOLIC PANEL
ALT: 26 U/L (ref 0–44)
AST: 20 U/L (ref 15–41)
Albumin: 3.2 g/dL — ABNORMAL LOW (ref 3.5–5.0)
Alkaline Phosphatase: 55 U/L (ref 38–126)
Anion gap: 10 (ref 5–15)
BUN: 20 mg/dL (ref 8–23)
CO2: 28 mmol/L (ref 22–32)
Calcium: 9.1 mg/dL (ref 8.9–10.3)
Chloride: 99 mmol/L (ref 98–111)
Creatinine, Ser: 1.01 mg/dL (ref 0.61–1.24)
GFR calc Af Amer: >60 mL/min (ref >60)
GFR calc non Af Amer: >60 mL/min (ref >60)
Glucose, Bld: 97 mg/dL (ref 70–99)
Potassium: 4.3 mmol/L (ref 3.5–5.1)
Sodium: 137 mmol/L (ref 135–145)
Total Bilirubin: 0.9 mg/dL (ref 0.3–1.2)
Total Protein: 6.8 g/dL (ref 6.5–8.1)

## 2020-03-02 LAB — FIBRIN DERIVATIVES D-DIMER (ARMC ONLY): Fibrin derivatives D-dimer (ARMC): 5152.75 ng/mL (FEU) — ABNORMAL HIGH (ref 0.00–499.00)

## 2020-03-02 LAB — CULTURE, BLOOD (ROUTINE X 2)
Culture: NO GROWTH
Culture: NO GROWTH
Special Requests: ADEQUATE
Special Requests: ADEQUATE

## 2020-03-02 LAB — MAGNESIUM: Magnesium: 2.2 mg/dL (ref 1.7–2.4)

## 2020-03-02 LAB — PHOSPHORUS: Phosphorus: 2.9 mg/dL (ref 2.5–4.6)

## 2020-03-02 LAB — FERRITIN: Ferritin: 482 ng/mL — ABNORMAL HIGH (ref 24–336)

## 2020-03-02 LAB — C-REACTIVE PROTEIN: CRP: 6 mg/dL — ABNORMAL HIGH (ref <1.0)

## 2020-03-02 MED ORDER — DEXAMETHASONE 6 MG PO TABS: 6.0000 mg | ORAL_TABLET | Freq: Every day | ORAL | 0 refills | Status: AC

## 2020-03-02 MED ORDER — ZINC SULFATE 220 (50 ZN) MG PO CAPS: 220.0000 mg | ORAL_CAPSULE | Freq: Every day | ORAL | 0 refills | Status: AC

## 2020-03-02 MED ORDER — RIVAROXABAN (XARELTO) VTE STARTER PACK (15 & 20 MG): ORAL_TABLET | ORAL | 0 refills | Status: AC

## 2020-03-02 MED ORDER — ASCORBIC ACID 500 MG PO TABS: 500.0000 mg | ORAL_TABLET | Freq: Every day | ORAL | 0 refills | Status: AC

## 2020-03-02 NOTE — TOC Progression Note (Signed)
Transition of Care [facility]) - Progression Note    Patient Details  Name: Darren Wang MRN: [id number] Date of Birth: 05-Apr-1951  Transition of Care [facility]) CM/SW Contact  Shelbie Hutching, RN Phone Number: 03/02/2020, 10:26 AM  Clinical Narrative:    Patient's wife updated on plan of care, potential discharge for today.  Patient will be going home on Xarelto, 30 day free coupon for Xarelto will be given to patient at discharge.     Expected Discharge Plan: [place] Barriers to Discharge: Continued Medical Work up  Expected Discharge Plan and Services Expected Discharge Plan: [place]   Discharge Planning Services: CM Consult Post Acute Care Choice: [place] arrangements for the past 2 months: Mobile Home                           HH Arranged: RN, PT, OT [facility] Agency: [place] ([facility]) Date HH Agency Contacted: 02/28/20 Time [place]: 1104 Representative spoke with at [place]: [place] (SDOH) Interventions    Readmission Risk Interventions Readmission Risk Prevention Plan 02/28/2020  Transportation Screening Complete  PCP or Specialist Appt within 3-5 Days Complete  HRI or [place] Complete  Social Work Consult for [place] Planning/Counseling Complete  Palliative Care Screening Complete  Medication Review [employer]) Complete  Some recent data might be hidden

## 2020-03-02 NOTE — Progress Notes (Signed)
SATURATION QUALIFICATIONS: (This note is used to comply with regulatory documentation for home oxygen)  Patient Saturations on Room Air at Rest = 94%  Patient Saturations on Room Air while Ambulating = 92%  Patient Saturations on 1 Liters of oxygen while Ambulating = 94%  Please briefly explain why patient needs home oxygen: patient reported feeling improved with [facility]

## 2020-03-02 NOTE — Plan of Care (Signed)
  Problem: Education: Goal: Knowledge of General Education information will improve Description: Including pain rating scale, medication(s)/side effects and non-pharmacologic comfort measures Outcome: Adequate for Discharge   

## 2020-03-02 NOTE — Care Management Important Message (Signed)
Important Message  Patient Details  Name: DEJA KAIGLER MRN: [id number] Date of Birth: [birth year]   Medicare Important Message Given:  Yes  Reviewed with patient via room phone due to isolation status.  Copy of Medicare IM to be left with nursing staff to bring in next time someone needs to enter room.    Dannette Barbara 03/02/2020, 11:40 AM

## 2020-03-02 NOTE — Discharge Summary (Signed)
Physician Discharge Summary  Darren Wang MAU:[id number] DOB: 04-16-1951 DOA: 02/26/2020  PCP: Sofie Hartigan, MD  Admit date: 02/26/2020 Discharge date: 03/02/2020  Time spent: 50* minutes  Recommendations for Outpatient Follow-up:  1. Isolation at home for 3 weeks starting from 02/26/20 uptil  03/18/2020 2. Continue Xarelto 3. Stop aspirin and Plavix till he is on Xarelto.  Patient can restart these medications after you get off Xarelto.   Discharge Diagnoses:  Principal Problem:   Pulmonary embolism ([place]) Active Problems:   Parkinson disease ([place])   Lewy body dementia ([place])   Pneumonia due to COVID-19 virus   Dementia [facility])   Discharge Condition: Stable  Diet recommendation: Heart healthy diet  Filed Weights   02/26/20 1135  Weight: 72.6 kg    History of present illness:  69 year old male with medical history of Parkinson disease, Lewy body dementia who was sent to ED by PCP for evaluation of shortness of breath.  Patient was diagnosed with COVID-19 viral infection 2 weeks ago he was tested positive on 02/13/2020 and was treated with short course of Augmentin, prednisone as well as bronchodilator therapy.  Patient symptomatically improved for past couple of days he developed worsening shortness of breath with minimal exertion.  Also complained of chest pain which was midsternal.  CTA showed extensive bilateral pulmonary emboli, moderate diffuse bilateral prominent groundglass appearing infiltrates. Patient started on IV heparin, remdesivir, Decadron  Hospital Course:  1. Acute pulmonary embolism-likely from underlying COVID-19 infection, CTA showed bilateral PE with no right heart strain.  Patient started on heparin drip.  Echocardiogram showed normal RV function.  No right heart strain.  Normal pulmonary pressure.  Heparin was discontinued and started on Xarelto per pharmacy.  Patient will be discharged on Xarelto starter pack.  He will need to be on Xarelto for 6  months. 2. Pneumonia due to COVID-19 infection-patient was tested positive for COVID-19 on 02/13/2020.  He was treated with steroid taper, antibiotics and bronchodilator therapy as outpatient.  In the hospital he has been started on remdesivir per pharmacy protocol.  Also started on Decadron 6 mg IV daily.    Patient is currently not requiring oxygen.  CRP on admission was 11.3 and his come down to 6.0.  Patient is stable for discharge.  He will be discharged on Decadron 6 mg daily for 4 more days.   3. Acute kidney injury-likely in setting of diuresis with IV Lasix started yesterday.  Lasix was held.  Patient's creatinine went up to 1.49.  Today it is down to 0.97. 4. 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  Continue buspirone, Namenda, Lexapro, Zoloft, Aricept, Seroquel 5. CAD-continue aspirin, statin, fish oil. 6. Bronchitis-resolved, patient was treated with Zithromax 500 mg daily. 7. Parkinson disease-continue Sinemet 8. History of TIA-patient was taking both aspirin and Plavix at home.  He has been started on Xarelto.  Will discontinue aspirin and Plavix, he can restart his medications after 6 months once he gets off Xarelto. *  Procedures:    Consultations:    Discharge Exam: Vitals:   03/02/20 0753 03/02/20 1222  BP: 126/73 108/77  Pulse: (!) 53 68  Resp: 18 20  Temp: (!) 97.2 F (36.2 C) 97.9 F (36.6 C)  SpO2: 99% 98%    General: Appears in no acute distress Cardiovascular: S1-S2, regular Respiratory: Clear to auscultation bilaterally  Discharge Instructions   Discharge Instructions    Diet - low sodium heart healthy   Complete by: As directed  Discharge instructions   Complete by: As directed    Home isolation till 03/18/20  Do not take aspirin or Plavix while you are on Xarelto for 6 months. You can start taking these medications again after you get off Xarelto in 6 months.   Increase activity slowly    Complete by: As directed      Allergies as of 03/02/2020      Reactions   Benadryl [diphenhydramine]    Neosporin [bacitracin-polymyxin B]       Medication List    STOP taking these medications   amoxicillin-clavulanate 875-125 MG tablet Commonly known as: AUGMENTIN   aspirin EC 81 MG tablet   clopidogrel 75 MG tablet Commonly known as: PLAVIX   predniSONE 10 MG tablet Commonly known as: DELTASONE     TAKE these medications   acetaminophen 500 MG tablet Commonly known as: TYLENOL Take by mouth.   albuterol 108 (90 Base) MCG/ACT inhaler Commonly known as: VENTOLIN HFA INHALE 2 INHALATIONS INTO THE LUNGS EVERY 4 HOURS AS NEEDED FOR WHEEZING OR SHORTNESS OF BREATH   ascorbic acid 500 MG tablet Commonly known as: VITAMIN C Take 1 tablet (500 mg total) by mouth daily. Start taking on: March 03, 2020   atorvastatin 10 MG tablet Commonly known as: LIPITOR Take 10 mg by mouth daily.   busPIRone 30 MG tablet Commonly known as: BUSPAR Take 15 mg by mouth in the morning and at bedtime.   Carbidopa-Levodopa ER 25-100 MG tablet controlled release Commonly known as: SINEMET CR Take 1 tablet by mouth 2 (two) times daily.   clonazePAM 1 MG tablet Commonly known as: KLONOPIN Take 1 mg by mouth at bedtime as needed.   dexamethasone 6 MG tablet Commonly known as: DECADRON Take 1 tablet (6 mg total) by mouth daily for 4 days.   donepezil 10 MG tablet Commonly known as: ARICEPT Take 10 mg by mouth daily before breakfast.   fexofenadine 180 MG tablet Commonly known as: ALLEGRA Take by mouth.   memantine 10 MG tablet Commonly known as: NAMENDA Take 10 mg by mouth daily.   multivitamin capsule Take 1 capsule by mouth daily.   Omega 3 1000 MG Caps Take 1 capsule by mouth daily.   pantoprazole 40 MG tablet Commonly known as: PROTONIX Take 40 mg by mouth 2 (two) times daily.   QUEtiapine 25 MG tablet Commonly known as: SEROQUEL Take 50 mg by mouth at bedtime.  At 1700   Rivaroxaban Stater Pack (15 mg and 20 mg) Commonly known as: XARELTO STARTER PACK Follow package directions: Take one 15mg  tablet by mouth twice a day. On day 21, switch to one 20mg  tablet once a day. Take with food.   sertraline 100 MG tablet Commonly known as: ZOLOFT Take 200 mg by mouth daily.   vitamin B-12 1000 MCG tablet Commonly known as: CYANOCOBALAMIN Take 1,000 mcg by mouth daily.   zinc sulfate 220 (50 Zn) MG capsule Take 1 capsule (220 mg total) by mouth daily. Start taking on: March 03, 2020            Durable Medical Equipment  (From admission, onward)         Start     Ordered   03/02/20 1150  For home use only DME 3 n 1  Once        03/02/20 1149         Allergies  Allergen Reactions  . Benadryl [Diphenhydramine]   . Neosporin [Bacitracin-Polymyxin B]  Follow-up Information    Sofie Hartigan, MD Follow up in 3 week(s).   Specialty: Family Medicine Contact information: [place] [address] [phone]                The results of significant diagnostics from this hospitalization (including imaging, microbiology, ancillary and laboratory) are listed below for reference.    Significant Diagnostic Studies: CT Angio Chest PE W and/or Wo Contrast  Result Date: 02/26/2020 CLINICAL DATA:  Hypoxia with left upper lobe mass on chest x-ray. EXAM: CT ANGIOGRAPHY CHEST WITH CONTRAST TECHNIQUE: Multidetector CT imaging of the chest was performed using the standard protocol during bolus administration of intravenous contrast. Multiplanar CT image reconstructions and MIPs were obtained to evaluate the vascular anatomy. CONTRAST:  12mL OMNIPAQUE IOHEXOL 350 MG/ML SOLN COMPARISON:  September 21, 2010 FINDINGS: Cardiovascular: Extensive intraluminal filling defects are seen throughout numerous branches of the bilateral pulmonary arteries. Involvement of the distal portions of the main pulmonary arteries is also seen. There is  no evidence of saddle embolus or heart strain. The heart is normal in size. No pericardial effusion is identified. Moderate severity coronary artery calcification is noted. Mediastinum/Nodes: No enlarged mediastinal, hilar, or axillary lymph nodes. Thyroid gland, trachea, and esophagus demonstrate no significant findings. Lungs/Pleura: Marked severity diffuse predominately ground-glass appearing infiltrates are seen throughout both lungs. There are small bilateral pleural effusions. No pneumothorax is identified. Upper Abdomen: Multiple surgical clips are seen within the gallbladder fossa. Musculoskeletal: Chronic compression fracture deformities are seen within the mid thoracic spine. Review of the MIP images confirms the above findings. IMPRESSION: 1. Extensive bilateral pulmonary emboli. 2. Marked severity diffuse bilateral predominately ground-glass appearing infiltrates. 3. Small bilateral pleural effusions. Electronically Signed   By: Virgina Norfolk M.D.   On: 02/26/2020 17:04   DG Chest Portable 1 View  Result Date: 02/26/2020 CLINICAL DATA:  Short of breath.  Coronavirus infection. EXAM: PORTABLE CHEST 1 VIEW COMPARISON:  09/08/2010 FINDINGS: Heart and mediastinal shadows are normal. Widespread hazy and patchy pulmonary infiltrates consistent with viral pneumonia. No dense consolidation, collapse or effusion. Density of the left upper chest favored to represent artifactual density. If not, a left upper lobe mass could be present. IMPRESSION: Widespread hazy and patchy pulmonary infiltrates consistent with viral pneumonia. Question focal density in the left upper lobe. This could be artifactual or could represent focal infiltrate or a mass lesion. Electronically Signed   By: Nelson Chimes M.D.   On: 02/26/2020 11:41   ECHOCARDIOGRAM COMPLETE  Result Date: 02/28/2020    ECHOCARDIOGRAM REPORT   Patient Name:   Darren Wang Date of Exam: 02/28/2020 Medical Rec #:  [id number]      Height:       68.0 in  Accession #:    [id number]     Weight:       160.0 lb Date of Birth:  [birth year]      BSA:          1.859 m Patient Age:    70 years       BP:           101/68 mmHg Patient Gender: M              HR:           55 bpm. Exam Location:  [facility] Procedure: 2D Echo, Cardiac Doppler and Color Doppler Indications:     Chest pain 786.50  History:         Patient  has no prior history of Echocardiogram examinations.                  Previous Myocardial Infarction; TIA.  Sonographer:     Sherrie Sport RDCS (AE) Referring Phys:  [place] Diagnosing Phys: Kathlyn Sacramento MD IMPRESSIONS  1. Left ventricular ejection fraction, by estimation, is 60 to 65%. The left ventricle has normal function. The left ventricle has no regional wall motion abnormalities. There is mild left ventricular hypertrophy. Left ventricular diastolic parameters were normal.  2. Right ventricular systolic function is normal. The right ventricular size is normal. There is normal pulmonary artery systolic pressure. The estimated right ventricular systolic pressure is 51.8 mmHg.  3. The mitral valve is normal in structure. No evidence of mitral valve regurgitation. No evidence of mitral stenosis.  4. The aortic valve is normal in structure. Aortic valve regurgitation is not visualized. Mild aortic valve sclerosis is present, with no evidence of aortic valve stenosis. FINDINGS  Left Ventricle: Left ventricular ejection fraction, by estimation, is 60 to 65%. The left ventricle has normal function. The left ventricle has no regional wall motion abnormalities. The left ventricular internal cavity size was normal in size. There is  mild left ventricular hypertrophy. Left ventricular diastolic parameters were normal. Right Ventricle: The right ventricular size is normal. No increase in right ventricular wall thickness. Right ventricular systolic function is normal. There is normal pulmonary artery systolic pressure. The tricuspid regurgitant velocity is 2.70  m/s, and  with an assumed right atrial pressure of 5 mmHg, the estimated right ventricular systolic pressure is 84.1 mmHg. Left Atrium: Left atrial size was normal in size. Right Atrium: Right atrial size was normal in size. Pericardium: There is no evidence of pericardial effusion. Mitral Valve: The mitral valve is normal in structure. Normal mobility of the mitral valve leaflets. No evidence of mitral valve regurgitation. No evidence of mitral valve stenosis. Tricuspid Valve: The tricuspid valve is normal in structure. Tricuspid valve regurgitation is mild . No evidence of tricuspid stenosis. Aortic Valve: The aortic valve is normal in structure. Aortic valve regurgitation is not visualized. Mild aortic valve sclerosis is present, with no evidence of aortic valve stenosis. Aortic valve mean gradient measures 5.0 mmHg. Aortic valve peak gradient measures 8.1 mmHg. Aortic valve area, by VTI measures 2.56 cm. Pulmonic Valve: The pulmonic valve was normal in structure. Pulmonic valve regurgitation is not visualized. No evidence of pulmonic stenosis. Aorta: The aortic root is normal in size and structure. Venous: The inferior vena cava was not well visualized. IAS/Shunts: No atrial level shunt detected by color flow Doppler.  LEFT VENTRICLE PLAX 2D LVIDd:         4.02 cm  Diastology LVIDs:         2.49 cm  LV e' lateral:   9.79 cm/s LV PW:         1.14 cm  LV E/e' lateral: 8.9 LV IVS:        1.22 cm  LV e' medial:    6.85 cm/s LVOT diam:     2.00 cm  LV E/e' medial:  12.7 LV SV:         75 LV SV Index:   40 LVOT Area:     3.14 cm  RIGHT VENTRICLE RV S prime:     13.80 cm/s TAPSE (M-mode): 4.6 cm LEFT ATRIUM           Index       RIGHT ATRIUM  Index LA diam:      3.10 cm 1.67 cm/m  RA Area:     18.90 cm LA Vol (A2C): 55.3 ml 29.75 ml/m RA Volume:   55.40 ml  29.80 ml/m LA Vol (A4C): 37.5 ml 20.17 ml/m  AORTIC VALVE                    PULMONIC VALVE AV Area (Vmax):    2.30 cm     PV Vmax:        0.91 m/s  AV Area (Vmean):   2.40 cm     PV Peak grad:   3.3 mmHg AV Area (VTI):     2.56 cm     RVOT Peak grad: 4 mmHg AV Vmax:           142.00 cm/s AV Vmean:          100.000 cm/s AV VTI:            0.293 m AV Peak Grad:      8.1 mmHg AV Mean Grad:      5.0 mmHg LVOT Vmax:         104.00 cm/s LVOT Vmean:        76.500 cm/s LVOT VTI:          0.239 m LVOT/AV VTI ratio: 0.82  AORTA Ao Root diam: 3.30 cm MITRAL VALVE               TRICUSPID VALVE MV Area (PHT): 2.04 cm    TR Peak grad:   29.2 mmHg MV Decel Time: 372 msec    TR Vmax:        270.00 cm/s MV E velocity: 86.80 cm/s MV A velocity: 55.70 cm/s  SHUNTS MV E/A ratio:  1.56        Systemic VTI:  0.24 m                            Systemic Diam: 2.00 cm Kathlyn Sacramento MD Electronically signed by Kathlyn Sacramento MD Signature Date/Time: 02/28/2020/11:47:07 AM    Final     Microbiology: Recent Results (from the past 240 hour(s))  Blood culture (routine x 2)     Status: None   Collection Time: 02/26/20 11:18 AM   Specimen: BLOOD  Result Value Ref Range Status   Specimen Description BLOOD RIGHT ANTECUBITAL  Final   Special Requests   Final    BOTTLES DRAWN AEROBIC AND ANAEROBIC Blood Culture adequate volume   Culture   Final    NO GROWTH 5 DAYS Performed at [facility], [address] [place], [place] [address]    Report Status 03/02/2020 FINAL  Final  Blood culture (routine x 2)     Status: None   Collection Time: 02/26/20 11:23 AM   Specimen: BLOOD  Result Value Ref Range Status   Specimen Description BLOOD RIGHT HAND  Final   Special Requests   Final    BOTTLES DRAWN AEROBIC AND ANAEROBIC Blood Culture adequate volume   Culture   Final    NO GROWTH 5 DAYS Performed at [facility], [address] [place],  [address]    Report Status 03/02/2020 FINAL  Final     Labs: Basic Metabolic Panel: Recent Labs  Lab 02/27/20 1250 02/28/20 0123 02/29/20 0544 03/01/20 0624 03/02/20 0603  NA 138 134* 138 137 137  K  3.7 4.7 4.2 4.6 4.3  CL 105  102 100 100 99  CO2 21* 21* 26 27 28   GLUCOSE 115* 138* 80 93 97  BUN 20 42* 22 20 20   CREATININE 0.82 1.49* 0.97 1.04 1.01  CALCIUM 8.3* 8.6* 8.9 8.7* 9.1  MG 2.0 2.4 2.2 2.2 2.2  PHOS 2.5 3.2 3.9 3.8 2.9   Liver Function Tests: Recent Labs  Lab 02/27/20 1250 02/28/20 0123 02/29/20 0544 03/01/20 0624 03/02/20 0603  AST 25 80* 26 25 20   ALT 11 64* 14 12 26   ALKPHOS 52 50 50 52 55  BILITOT 0.7 0.8 0.9 1.0 0.9  PROT 6.6 6.0* 6.6 6.5 6.8  ALBUMIN 2.8* 2.8* 3.0* 3.0* 3.2*   No results for input(s): LIPASE, AMYLASE in the last 168 hours. No results for input(s): AMMONIA in the last 168 hours. CBC: Recent Labs  Lab 02/27/20 1250 02/28/20 0123 02/29/20 0544 03/01/20 0624 03/02/20 0603  WBC 15.2* 11.4* 8.9 8.4 9.0  NEUTROABS 13.6* 10.5* 6.0 6.0 6.6  HGB 10.9* 11.9* 11.3* 11.9* 11.4*  HCT 32.2* 34.3* 33.4* 36.2* 34.3*  MCV 88.7 89.1 88.6 89.2 91.0  PLT 307 200 331 340 355   Cardiac Enzymes: No results for input(s): CKTOTAL, CKMB, CKMBINDEX, TROPONINI in the last 168 hours. BNP: BNP (last 3 results) Recent Labs    02/26/20 1050  BNP 214.3*    ProBNP (last 3 results) No results for input(s): PROBNP in the last 8760 hours.  CBG: No results for input(s): GLUCAP in the last 168 hours.     Signed:  Oswald Hillock MD.  [facility] 03/02/2020, 12:54 PM

## 2020-03-02 NOTE — Discharge Instructions (Signed)
Home isolation for 3 weeks till 03/18/20  COVID-19: How to [place] and Others Know how it spreads  There is currently no vaccine to prevent coronavirus disease 2019 (COVID-19).  The best way to prevent illness is to avoid being exposed to this virus.  The virus is thought to spread mainly from person-to-person. COVID-19: Quarantine vs. Isolation QUARANTINE keeps someone who was in close contact with someone who has COVID-19 away from others. If you had close contact with a person who has COVID-19 Stay home until 14 days after your last contact. Check your temperature twice a day and watch for symptoms of COVID-19. If possible, stay away from people who are at higher-risk for getting very sick from COVID-19. ISOLATION keeps someone who is sick or tested positive for COVID-19 without symptoms away from others, even in their own home. If you are sick and think or know you have COVID-19 Stay home until after At least 10 days since symptoms first appeared and At least 24 hours with no fever without fever-reducing medication and Symptoms have improved If you tested positive for COVID-19 but do not have symptoms Stay home until after 10 days have passed since your positive test If you live with others, stay in a specific "sick room" or area and away from other people or animals, including pets. Use a separate bathroom, if available. [email or web address] 01/27/2019 This information is not intended to replace advice given to you by your health care provider. Make sure you discuss any questions you have with your health care provider. Document Revised: 06/12/2019 Document Reviewed: 06/12/2019 Elsevier Patient Education  [employer].   ? Between people who are in close contact with one another (within about 6 feet). ? Through respiratory droplets produced when an infected person coughs, sneezes or talks. ? These droplets can land in the mouths or noses of people who are  nearby or possibly be inhaled into the lungs. ? COVID-19 may be spread by people who are not showing symptoms. Everyone should Clean your hands often  Wash your hands often with soap and water for at least 20 seconds especially after you have been in a public place, or after blowing your nose, coughing, or sneezing.  If soap and water are not readily available, use a hand sanitizer that contains at least 60% alcohol. Cover all surfaces of your hands and rub them together until they feel dry.  Avoid touching your eyes, nose, and mouth with unwashed hands. Avoid close contact  Limit contact with others as much as possible.  Avoid close contact with people who are sick.  Put distance between yourself and other people. ? Remember that some people without symptoms may be able to spread virus. ? This is especially important for people who are at higher risk of getting very Gain[email or web address] Cover your mouth and nose with a mask when around others  You could spread COVID-19 to others even if you do not feel sick.  Everyone should wear a mask in public settings and when around people not living in their household, especially when social distancing is difficult to maintain. ? Masks should not be placed on young children under age 9, anyone who has trouble breathing, or is unconscious, incapacitated or otherwise unable to remove the mask without assistance.  The mask is meant to protect other people in case you are infected.  Do NOT use a facemask meant for a Dietitian.  Continue to keep about 6 feet between yourself and others.  The mask is not a substitute for social distancing. Cover coughs and sneezes  Always cover your mouth and nose with a tissue when you cough or sneeze or use the inside of your elbow.  Throw used tissues in the trash.  Immediately wash your hands with soap and water for at least 20  seconds. If soap and water are not readily available, clean your hands with a hand sanitizer that contains at least 60% alcohol. Clean and disinfect  Clean AND disinfect frequently touched surfaces daily. This includes tables, doorknobs, light switches, countertops, handles, desks, phones, keyboards, toilets, faucets, and sinks. [email or web address]  If surfaces are dirty, clean them: Use detergent or soap and water prior to disinfection.  Then, use a household disinfectant. You can see a list of EPA-registered household disinfectants here. [email or web address].[email or web address] 03/12/2019 This information is not intended to replace advice given to you by your health care provider. Make sure you discuss any questions you have with your health care provider. Document Revised: 03/20/2019 Document Reviewed: 01/16/2019 Elsevier Patient Education  [place] on my medicine - XARELTO (rivaroxaban)  This medication education was reviewed with me or my healthcare representative as part of my discharge preparation.    WHY WAS XARELTO PRESCRIBED FOR YOU? Xarelto was prescribed to treat blood clots that may have been found in the veins of your legs (deep vein thrombosis) or in your lungs (pulmonary embolism) and to reduce the risk of them occurring again.  What do you need to know about Xarelto? The starting dose is one 15 mg tablet taken TWICE daily with food for the FIRST 21 DAYS then on 03/22/2020  the dose is changed to one 20 mg tablet taken ONCE A DAY with your evening meal.  DO NOT stop taking Xarelto without talking to the health care provider who prescribed the medication.  Refill your prescription for 20 mg tablets before you run out.  After discharge, you should have regular check-up appointments with your healthcare provider that is prescribing your Xarelto.  In the future your dose may need to be changed if your kidney  function changes by a significant amount.  What do you do if you miss a dose? If you are taking Xarelto TWICE DAILY and you miss a dose, take it as soon as you remember. You may take two 15 mg tablets (total 30 mg) at the same time then resume your regularly scheduled 15 mg twice daily the next day.  If you are taking Xarelto ONCE DAILY and you miss a dose, take it as soon as you remember on the same day then continue your regularly scheduled once daily regimen the next day. Do not take two doses of Xarelto at the same time.   Important Safety Information Xarelto is a blood thinner medicine that can cause bleeding. You should call your healthcare provider right away if you experience any of the following: ? Bleeding from an injury or your nose that does not stop. ? Unusual colored urine (red or dark brown) or unusual colored stools (red or black). ? Unusual bruising for unknown reasons. ? A serious fall or if you hit your head (even if there is no bleeding).  Some medicines may interact with Xarelto and might increase your risk of bleeding while on Xarelto. To help avoid this, consult your healthcare provider or pharmacist prior to using any new prescription or non-prescription medications, including herbals, vitamins, non-steroidal anti-inflammatory drugs (NSAIDs) and supplements.  This website has more information on Xarelto:  [email or web address]-benson.[email or web address]

## 2020-03-02 NOTE — TOC Transition Note (Signed)
Transition of Care [facility]) - CM/SW Discharge Note   Patient Details  Name: Darren Wang MRN: [id number] Date of Birth: [birth year]  Transition of Care [facility]) CM/SW Contact:  Shelbie Hutching, RN Phone Number: 03/02/2020, 12:54 PM   Clinical Narrative:    Patient is medically cleared for discharge home with home health services.  Patient did not qualify for home oxygen.  [facility] will provide RN, PT, and OT, Floydene Flock with [facility] is aware of discharge.  3 in 1 ordered for the home and will be delivered to the patient's room.  Patient's wife will pick him up today.    Final next level of care: [place] Barriers to Discharge: Barriers Resolved   Patient Goals and CMS Choice Patient states their goals for this hospitalization and ongoing recovery are:: to get rid of this stuff in my lungs CMS Medicare.gov Compare Post Acute Care list provided to:: Patient Choice offered to / list presented to : Patient  Discharge Placement                       Discharge Plan and Services   Discharge Planning Services: CM Consult Post Acute Care Choice: Home Health          DME Arranged: 3-N-1 DME Agency: [employer] Date DME Agency Contacted: 03/02/20 Time DME Agency Contacted: 1200 Representative spoke with at DME Agency: [place]: RN, PT, OT [facility] Agency: Robert Lee ([place]) Date [place]: 03/02/20 Time [place]: 1253 Representative spoke with at [place]: [place] (SDOH) Interventions     Readmission Risk Interventions Readmission Risk Prevention Plan 02/28/2020  Transportation Screening Complete  PCP or Specialist Appt within 3-5 Days Complete  HRI or [place] Complete  Social Work Consult for Startup Planning/Counseling Complete  Palliative Care Screening Complete  Medication Review Press photographer) Complete  Some recent data might be hidden

## 2020-03-02 NOTE — Progress Notes (Signed)
Pt has been given all discharge instructions and verbalized no further instruction.  Bot peripheral ivs have been removed catheters intact.  Gauze dressings are dry and intact.  Pt has also been given and explained his xarelto voucher.  Pt stated his wife is about 35 minutes away and he would call when she arrives to medical mall

## 2020-03-08 MED FILL — Zinc Sulfate Cap 220 MG (50 MG Elemental Zn): ORAL | Qty: 220 | Status: AC

## 2020-03-08 MED FILL — Omega-3-acid Ethyl Esters Cap 1 GM: ORAL | Qty: 1 | Status: AC

## 2020-03-08 MED FILL — Multiple Vitamin Tab: ORAL | Qty: 1 | Status: AC

## 2020-03-08 MED FILL — Sertraline HCl Tab 50 MG: ORAL | Qty: 100 | Status: AC

## 2020-03-08 MED FILL — Memantine HCl Tab 5 MG: ORAL | Qty: 10 | Status: AC

## 2020-03-08 MED FILL — Cyanocobalamin Tab 1000 MCG: ORAL | Qty: 1000 | Status: AC

## 2020-03-08 MED FILL — Sodium Chloride Flush IV Soln 0.9%: INTRAVENOUS | Qty: 3 | Status: AC

## 2020-03-08 MED FILL — Escitalopram Oxalate Tab 10 MG (Base Equiv): ORAL | Qty: 20 | Status: AC

## 2020-03-08 MED FILL — Heparin Sod (Porcine) in NaCl IV Soln 25000 Unit/250ML-0.45%: INTRAVENOUS | Qty: 250 | Status: AC

## 2020-03-08 MED FILL — Pantoprazole Sodium EC Tab 40 MG (Base Equiv): ORAL | Qty: 40 | Status: AC

## 2020-03-08 MED FILL — Buspirone HCl Tab 15 MG: ORAL | Qty: 30 | Status: AC

## 2020-03-08 MED FILL — Loratadine Tab 10 MG: ORAL | Qty: 10 | Status: AC

## 2020-03-08 MED FILL — Azithromycin Tab 500 MG: ORAL | Qty: 500 | Status: AC

## 2020-03-08 MED FILL — Carbidopa & Levodopa Tab ER 25-100 MG: ORAL | Qty: 1 | Status: AC

## 2020-03-08 MED FILL — Aspirin Tab Delayed Release 81 MG: ORAL | Qty: 81 | Status: AC

## 2020-03-08 MED FILL — Dexamethasone Sodium Phosphate Inj 10 MG/ML: INTRAMUSCULAR | Qty: 0.6 | Status: AC

## 2020-03-08 MED FILL — Atorvastatin Calcium Tab 20 MG (Base Equivalent): ORAL | Qty: 10 | Status: AC

## 2021-10-27 ENCOUNTER — Encounter: Payer: Self-pay | Admitting: *Deleted

## 2021-10-28 ENCOUNTER — Encounter: Payer: Self-pay | Admitting: *Deleted

## 2021-10-28 ENCOUNTER — Other Ambulatory Visit: Payer: Self-pay

## 2021-10-28 ENCOUNTER — Encounter
Admission: RE | Disposition: A | Discharge status: Home / Self Care | Payer: Self-pay | Source: Home / Self Care | Attending: Gastroenterology

## 2021-10-28 ENCOUNTER — Ambulatory Visit
Admission: RE | Admit: 2021-10-28 | Discharge: 2021-10-28 | Disposition: A | Discharge status: Home / Self Care | Payer: Medicare Other | Attending: Gastroenterology | Admitting: Gastroenterology

## 2021-10-28 ENCOUNTER — Ambulatory Visit: Payer: Medicare Other | Admitting: Certified Registered"

## 2021-10-28 DIAGNOSIS — Z09 Encounter for follow-up examination after completed treatment for conditions other than malignant neoplasm: Secondary | ICD-10-CM | POA: Diagnosis present

## 2021-10-28 DIAGNOSIS — K640 First degree hemorrhoids: Secondary | ICD-10-CM | POA: Diagnosis not present

## 2021-10-28 DIAGNOSIS — D128 Benign neoplasm of rectum: Secondary | ICD-10-CM | POA: Diagnosis not present

## 2021-10-28 DIAGNOSIS — F32A Depression, unspecified: Secondary | ICD-10-CM | POA: Diagnosis not present

## 2021-10-28 DIAGNOSIS — I6990 Unspecified sequelae of unspecified cerebrovascular disease: Secondary | ICD-10-CM | POA: Insufficient documentation

## 2021-10-28 DIAGNOSIS — E785 Hyperlipidemia, unspecified: Secondary | ICD-10-CM | POA: Diagnosis not present

## 2021-10-28 DIAGNOSIS — K219 Gastro-esophageal reflux disease without esophagitis: Secondary | ICD-10-CM | POA: Insufficient documentation

## 2021-10-28 DIAGNOSIS — I2510 Atherosclerotic heart disease of native coronary artery without angina pectoris: Secondary | ICD-10-CM | POA: Diagnosis not present

## 2021-10-28 DIAGNOSIS — F0390 Unspecified dementia without behavioral disturbance: Secondary | ICD-10-CM | POA: Insufficient documentation

## 2021-10-28 DIAGNOSIS — Z87891 Personal history of nicotine dependence: Secondary | ICD-10-CM | POA: Insufficient documentation

## 2021-10-28 DIAGNOSIS — Z9049 Acquired absence of other specified parts of digestive tract: Secondary | ICD-10-CM | POA: Insufficient documentation

## 2021-10-28 HISTORY — DX: Angina pectoris, unspecified: I20.9

## 2021-10-28 HISTORY — PX: COLONOSCOPY WITH PROPOFOL: SHX5780

## 2021-10-28 SURGERY — COLONOSCOPY WITH PROPOFOL
Anesthesia: General

## 2021-10-28 MED ORDER — PROPOFOL 500 MG/50ML IV EMUL
INTRAVENOUS | Status: DC | PRN
  Administered 2021-10-28: 125 ug/kg/min via INTRAVENOUS

## 2021-10-28 MED ORDER — LIDOCAINE HCL (CARDIAC) PF 100 MG/5ML IV SOSY
PREFILLED_SYRINGE | INTRAVENOUS | Status: DC | PRN
  Administered 2021-10-28: 100 mg via INTRAVENOUS

## 2021-10-28 MED ORDER — SODIUM CHLORIDE 0.9 % IV SOLN: INTRAVENOUS | Status: DC

## 2021-10-28 MED ORDER — PROPOFOL 10 MG/ML IV BOLUS
INTRAVENOUS | Status: DC | PRN
  Administered 2021-10-28: 80 mg via INTRAVENOUS

## 2021-10-28 NOTE — Interval H&P Note (Signed)
History and Physical Interval Note: ? ?10/28/2021 ?2:56 PM ? ?Darren Wang  has presented today for surgery, with the diagnosis of Hx of adenomarous polypof colon.  The various methods of treatment have been discussed with the patient and family. After consideration of risks, benefits and other options for treatment, the patient has consented to  Procedure(s): ?COLONOSCOPY WITH PROPOFOL (N/A) as a surgical intervention.  The patient's history has been reviewed, patient examined, no change in status, stable for surgery.  I have reviewed the patient's chart and labs.  Questions were answered to the patient's satisfaction.   ? ? ?Darren Wang ? ?Ok to proceed with colonoscopy ?

## 2021-10-28 NOTE — Op Note (Signed)
[facility] ?Gastroenterology ?Patient Name: Darren Wang ?Procedure Date: 10/28/2021 2:45 PM ?MRN: [id number] ?Account #: [id number] ?Date of Birth: 1950-07-28 ?Admit Type: Outpatient ?Age: 71 ?Room: [facility] ENDO ROOM 3 ?Gender: Male ?Note Status: Finalized ?Instrument Name: Colonscope [id number] ?Procedure:             Colonoscopy ?Indications:           Surveillance: Personal history of adenomatous polyps  ?                       on last colonoscopy 3 years ago ?Providers:             Andrey Farmer MD, MD ?Referring MD:          Sofie Hartigan (Referring MD) ?Medicines:             Monitored Anesthesia Care ?Complications:         No immediate complications. Estimated blood loss:  ?                       Minimal. ?Procedure:             Pre-Anesthesia Assessment: ?                       - Prior to the procedure, a History and Physical was  ?                       performed, and patient medications and allergies were  ?                       reviewed. The patient is competent. The risks and  ?                       benefits of the procedure and the sedation options and  ?                       risks were discussed with the patient. All questions  ?                       were answered and informed consent was obtained.  ?                       Patient identification and proposed procedure were  ?                       verified by the physician, the nurse, the  ?                       anesthesiologist, the anesthetist and the technician  ?                       in the endoscopy suite. Mental Status Examination:  ?                       alert and oriented. Airway Examination: normal  ?                       oropharyngeal airway and neck mobility. Respiratory  ?  Examination: clear to auscultation. CV Examination:  ?                       normal. Prophylactic Antibiotics: The patient does not  ?                       require prophylactic antibiotics. Prior  ?                        Anticoagulants: The patient has taken Plavix  ?                       (clopidogrel), last dose was 5 days prior to  ?                       procedure. ASA Grade Assessment: II - A patient with  ?                       mild systemic disease. After reviewing the risks and  ?                       benefits, the patient was deemed in satisfactory  ?                       condition to undergo the procedure. The anesthesia  ?                       plan was to use monitored anesthesia care (MAC).  ?                       Immediately prior to administration of medications,  ?                       the patient was re-assessed for adequacy to receive  ?                       sedatives. The heart rate, respiratory rate, oxygen  ?                       saturations, blood pressure, adequacy of pulmonary  ?                       ventilation, and response to care were monitored  ?                       throughout the procedure. The physical status of the  ?                       patient was re-assessed after the procedure. ?                       After obtaining informed consent, the colonoscope was  ?                       passed under direct vision. Throughout the procedure,  ?                       the patient's blood pressure, pulse, and oxygen  ?  saturations were monitored continuously. The  ?                       Colonoscope was introduced through the anus and  ?                       advanced to the the cecum, identified by appendiceal  ?                       orifice and ileocecal valve. The colonoscopy was  ?                       performed without difficulty. The patient tolerated  ?                       the procedure well. The quality of the bowel  ?                       preparation was inadequate. ?Findings: ?     The perianal and digital rectal examinations were normal. ?     A 2 mm polyp was found in the rectum. The polyp was sessile. The polyp  ?     was removed with a jumbo cold forceps.  Resection and retrieval were  ?     complete. Estimated blood loss was minimal. ?     Internal hemorrhoids were found during retroflexion. The hemorrhoids  ?     were Grade I (internal hemorrhoids that do not prolapse). ?     The exam was otherwise without abnormality on direct and retroflexion  ?     views. ?Impression:            - Preparation of the colon was inadequate. ?                       - One 2 mm polyp in the rectum, removed with a jumbo  ?                       cold forceps. Resected and retrieved. ?                       - Internal hemorrhoids. ?                       - The examination was otherwise normal on direct and  ?                       retroflexion views. ?Recommendation:        - Discharge patient to home. ?                       - Resume previous diet. ?                       - Resume Plavix (clopidogrel) at prior dose today. ?                       - Await pathology results. ?                       - Repeat colonoscopy in 6 months because the bowel  ?  preparation was suboptimal. ?                       - Return to referring physician as previously  ?                       scheduled. ?Procedure Code(s):     --- Professional --- ?                       [phone], Colonoscopy, flexible; with biopsy, single or  ?                       multiple ?Diagnosis Code(s):     --- Professional --- ?                       Z86.010, Personal history of colonic polyps ?                       K64.0, First degree hemorrhoids ?                       K62.1, Rectal polyp ?CPT copyright 2019 American Medical Association. All rights reserved. ?The codes documented in this report are preliminary and upon coder review may  ?be revised to meet current compliance requirements. ?Andrey Farmer MD, MD ?10/28/2021 3:23:30 PM ?Number of Addenda: 0 ?Note Initiated On: 10/28/2021 2:45 PM ?Estimated Blood Loss:  Estimated blood loss was minimal. ?     [facility] [place] ?

## 2021-10-28 NOTE — Anesthesia Postprocedure Evaluation (Signed)
Anesthesia Post Note ? ?Patient: Darren Wang ? ?Procedure(s) Performed: COLONOSCOPY WITH PROPOFOL ? ?Anesthesia Type: General ?Level of consciousness: patient uncooperative ?Anesthetic complications: no ? ? ?No notable events documented. ? ? ?Last Vitals:  ?Vitals:  ? 10/28/21 1426  ?BP: (!) 107/96  ?Pulse: 61  ?Resp: 17  ?Temp: (!) 35.8 ?C  ?SpO2: 99%  ?  ?Last Pain:  ?Vitals:  ? 10/28/21 1426  ?TempSrc: Temporal  ?PainSc: 0-No pain  ? ? ?  ?  ?  ?  ?  ?  ? ?Kerri Perches ? ? ? ? ?

## 2021-10-28 NOTE — Anesthesia Preprocedure Evaluation (Signed)
Anesthesia Evaluation  ?Patient identified by MRN, date of birth, ID band ?Patient awake ? ? ? ?Reviewed: ?Allergy & Precautions, H&P , NPO status , Patient's Chart, lab work & pertinent test results, reviewed documented beta blocker date and time  ? ?History of Anesthesia Complications ?Negative for: history of anesthetic complications ? ?Airway ?Mallampati: I ? ?TM Distance: >3 FB ?Neck ROM: full ? ? ? Dental ? ?(+) Dental Advidsory Given, Teeth Intact ?  ?Pulmonary ?neg pulmonary ROS, former smoker,  ?H/o Pulmonary embolism- anticoagulation held ?  ?Pulmonary exam normal ? ? ? ? ? ? ? Cardiovascular ?Exercise Tolerance: Good ?(-) hypertension(-) angina+ CAD  ?(-) Cardiac Stents and (-) CABG Normal cardiovascular exam(-) dysrhythmias (-) Valvular Problems/Murmurs ? ? ?  ?Neuro/Psych ?neg Seizures PSYCHIATRIC DISORDERS Depression Dementia TIAResidual Symptoms   ? GI/Hepatic ?Neg liver ROS, GERD  Controlled,  ?Endo/Other  ?negative endocrine ROS ? Renal/GU ?negative Renal ROS  ?negative genitourinary ?  ?Musculoskeletal ? ? Abdominal ?  ?Peds ? Hematology ?negative hematology ROS ?(+)   ?Anesthesia Other Findings ?Past Medical History: ?No date: Dementia [facility]) ?No date: Dementia [facility]) ?No date: GERD (gastroesophageal reflux disease) ?No date: History of kidney stones ?No date: Myocardial infarction [facility]) ?No date: Parkinson disease [facility]) ?No date: TIA (transient ischemic attack) ? ? Reproductive/Obstetrics ?negative OB ROS ? ?  ? ? ? ? ? ? ? ? ? ? ? ? ? ?  ?  ? ? ? ? ? ? ? ? ?Anesthesia Physical ? ?Anesthesia Plan ? ?ASA: III ? ?Anesthesia Plan: General  ? ?Post-op Pain Management:   ? ?Induction: Intravenous ? ?PONV Risk Score and Plan: 2 and Propofol infusion and TIVA ? ?Airway Management Planned: Natural Airway and Nasal Cannula ? ?Additional Equipment:  ? ?Intra-op Plan:  ? ?Post-operative Plan:  ? ?Informed Consent: I have reviewed the patients History and Physical, chart, labs  and discussed the procedure including the risks, benefits and alternatives for the proposed anesthesia with the patient or authorized representative who has indicated his/her understanding and acceptance.  ? ? ? ?Dental Advisory Given ? ?Plan Discussed with: Anesthesiologist, CRNA and Surgeon ? ?Anesthesia Plan Comments:   ? ? ? ? ? ? ?Anesthesia Quick Evaluation ? ?

## 2021-10-28 NOTE — H&P (Signed)
Outpatient short stay form Pre-procedure ?10/28/2021  ?Lesly Rubenstein, MD ? ?Primary Physician: Sofie Hartigan, MD ? ?Reason for visit:  Surveillance colonoscopy ? ?History of present illness:   ? ?71 y/o gentleman with history of HLD and GERD here for surveillance colonoscopy. Last colonoscopy was about three years ago with three Ta's. History of cholecystectomy. Takes plavix with last dose 5 days ago. No family history of GI malignancies. ? ? ? ?Current Facility-Administered Medications:  ?  0.9 %  sodium chloride infusion, , Intravenous, Continuous, Evey Mcmahan, Hilton Cork, MD, Last Rate: 20 mL/hr at 10/28/21 1426, New Bag at 10/28/21 1426 ? ?Medications Prior to Admission  ?Medication Sig Dispense Refill Last Dose  ? albuterol (VENTOLIN HFA) 108 (90 Base) MCG/ACT inhaler INHALE 2 INHALATIONS INTO THE LUNGS EVERY 4 HOURS AS NEEDED FOR WHEEZING OR SHORTNESS OF BREATH   10/27/2021  ? ascorbic acid (VITAMIN C) 500 MG tablet Take 1 tablet (500 mg total) by mouth daily. 10 tablet 0 Past Week  ? atorvastatin (LIPITOR) 10 MG tablet Take 10 mg by mouth daily.   10/27/2021  ? busPIRone (BUSPAR) 30 MG tablet Take 15 mg by mouth in the morning and at bedtime.    10/27/2021  ? Carbidopa-Levodopa ER (SINEMET CR) 25-100 MG tablet controlled release Take 1 tablet by mouth 2 (two) times daily.   10/27/2021  ? clonazePAM (KLONOPIN) 1 MG tablet Take 1 mg by mouth at bedtime as needed.    10/27/2021  ? donepezil (ARICEPT) 10 MG tablet Take 10 mg by mouth daily before breakfast.   10/27/2021  ? memantine (NAMENDA) 10 MG tablet Take 10 mg by mouth daily.    10/27/2021  ? Multiple Vitamin (MULTIVITAMIN) capsule Take 1 capsule by mouth daily.   Past Week  ? pantoprazole (PROTONIX) 40 MG tablet Take 40 mg by mouth 2 (two) times daily.    10/27/2021  ? QUEtiapine (SEROQUEL) 25 MG tablet Take 50 mg by mouth at bedtime. At 1700    10/27/2021  ? sertraline (ZOLOFT) 100 MG tablet Take 200 mg by mouth daily.    10/27/2021  ? acetaminophen (TYLENOL)  500 MG tablet Take by mouth.     ? fexofenadine (ALLEGRA) 180 MG tablet Take by mouth.     ? Omega 3 1000 MG CAPS Take 1 capsule by mouth daily. (Patient not taking: Reported on 10/28/2021)   Not Taking  ? RIVAROXABAN (XARELTO) VTE STARTER PACK (15 & 20 MG TABLETS) Follow package directions: Take one '15mg'$  tablet by mouth twice a day. On day 21, switch to one '20mg'$  tablet once a day. Take with food. 49 each 0 10/22/2021  ? vitamin B-12 (CYANOCOBALAMIN) 1000 MCG tablet Take 1,000 mcg by mouth daily. (Patient not taking: Reported on 10/28/2021)   Not Taking  ? zinc sulfate 220 (50 Zn) MG capsule Take 1 capsule (220 mg total) by mouth daily. (Patient not taking: Reported on 10/28/2021) 10 capsule 0 Not Taking  ? ? ? ?Allergies  ?Allergen Reactions  ? Benadryl [Diphenhydramine]   ? Neosporin [Bacitracin-Polymyxin B]   ? ? ? ?Past Medical History:  ?Diagnosis Date  ? Anginal pain ([place])   ? Dementia ([place])   ? Dementia ([place])   ? GERD (gastroesophageal reflux disease)   ? History of kidney stones   ? Myocardial infarction [facility])   ? Parkinson disease ([place])   ? TIA (transient ischemic attack)   ? ? ?Review of systems:  Otherwise negative.  ? ? ?Physical Exam ? ?Gen:  Alert, oriented. Appears stated age.  ?HEENT: PERRLA. ?Lungs: No respiratory distress ?CV: RRR ?Abd: soft, benign, no masses ?Ext: No edema ? ? ? ?Planned procedures: Proceed with colonoscopy. The patient understands the nature of the planned procedure, indications, risks, alternatives and potential complications including but not limited to bleeding, infection, perforation, damage to internal organs and possible oversedation/side effects from anesthesia. The patient agrees and gives consent to proceed.  ?Please refer to procedure notes for findings, recommendations and patient disposition/instructions.  ? ? ? ?Lesly Rubenstein, MD ?[facility] ? ? ? ?  ? ?

## 2021-10-28 NOTE — Transfer of Care (Signed)
Immediate Anesthesia Transfer of Care Note ? ?Patient: Darren Wang ? ?Procedure(s) Performed: COLONOSCOPY WITH PROPOFOL ? ?Patient Location: PACU and Endoscopy Unit ? ?Anesthesia Type:General ? ?Level of Consciousness: awake ? ?Airway & Oxygen Therapy: Patient Spontanous Breathing ? ?Post-op Assessment: Report given to RN ? ?Post vital signs: stable ? ?Last Vitals:  ?Vitals Value Taken Time  ?BP    ?Temp    ?Pulse    ?Resp    ?SpO2    ? ? ?Last Pain:  ?Vitals:  ? 10/28/21 1426  ?TempSrc: Temporal  ?PainSc: 0-No pain  ?   ? ?  ? ?Complications: No notable events documented. ?

## 2021-10-31 ENCOUNTER — Encounter: Payer: Self-pay | Admitting: Gastroenterology

## 2021-11-01 LAB — SURGICAL PATHOLOGY

## 2022-02-07 ENCOUNTER — Ambulatory Visit: Payer: Medicare Other | Admitting: General Practice

## 2022-02-07 ENCOUNTER — Ambulatory Visit
Admission: RE | Admit: 2022-02-07 | Discharge: 2022-02-07 | Disposition: A | Discharge status: Home / Self Care | Payer: Medicare Other | Source: Ambulatory Visit | Attending: Gastroenterology | Admitting: Gastroenterology

## 2022-02-07 ENCOUNTER — Encounter: Payer: Self-pay | Admitting: *Deleted

## 2022-02-07 ENCOUNTER — Encounter
Admission: RE | Disposition: A | Discharge status: Home / Self Care | Payer: Self-pay | Source: Ambulatory Visit | Attending: Gastroenterology

## 2022-02-07 DIAGNOSIS — Z1211 Encounter for screening for malignant neoplasm of colon: Secondary | ICD-10-CM | POA: Insufficient documentation

## 2022-02-07 DIAGNOSIS — Z87891 Personal history of nicotine dependence: Secondary | ICD-10-CM | POA: Insufficient documentation

## 2022-02-07 DIAGNOSIS — K640 First degree hemorrhoids: Secondary | ICD-10-CM | POA: Insufficient documentation

## 2022-02-07 DIAGNOSIS — I2510 Atherosclerotic heart disease of native coronary artery without angina pectoris: Secondary | ICD-10-CM | POA: Insufficient documentation

## 2022-02-07 DIAGNOSIS — E785 Hyperlipidemia, unspecified: Secondary | ICD-10-CM | POA: Insufficient documentation

## 2022-02-07 DIAGNOSIS — Z9049 Acquired absence of other specified parts of digestive tract: Secondary | ICD-10-CM | POA: Insufficient documentation

## 2022-02-07 DIAGNOSIS — F32A Depression, unspecified: Secondary | ICD-10-CM | POA: Diagnosis not present

## 2022-02-07 DIAGNOSIS — K219 Gastro-esophageal reflux disease without esophagitis: Secondary | ICD-10-CM | POA: Insufficient documentation

## 2022-02-07 DIAGNOSIS — G20 Parkinson's disease: Secondary | ICD-10-CM | POA: Insufficient documentation

## 2022-02-07 DIAGNOSIS — Z8673 Personal history of transient ischemic attack (TIA), and cerebral infarction without residual deficits: Secondary | ICD-10-CM | POA: Insufficient documentation

## 2022-02-07 DIAGNOSIS — F0280 Dementia in other diseases classified elsewhere without behavioral disturbance: Secondary | ICD-10-CM | POA: Insufficient documentation

## 2022-02-07 DIAGNOSIS — D120 Benign neoplasm of cecum: Secondary | ICD-10-CM | POA: Diagnosis not present

## 2022-02-07 DIAGNOSIS — I252 Old myocardial infarction: Secondary | ICD-10-CM | POA: Diagnosis not present

## 2022-02-07 DIAGNOSIS — Z86010 Personal history of colonic polyps: Secondary | ICD-10-CM | POA: Insufficient documentation

## 2022-02-07 HISTORY — PX: COLONOSCOPY: SHX5424

## 2022-02-07 SURGERY — COLONOSCOPY
Anesthesia: General

## 2022-02-07 MED ORDER — PROPOFOL 500 MG/50ML IV EMUL
INTRAVENOUS | Status: DC | PRN
  Administered 2022-02-07: 100 ug/kg/min via INTRAVENOUS

## 2022-02-07 MED ORDER — STERILE WATER FOR IRRIGATION IR SOLN
Status: DC | PRN
  Administered 2022-02-07: 5 mL

## 2022-02-07 MED ORDER — LIDOCAINE HCL (CARDIAC) PF 100 MG/5ML IV SOSY
PREFILLED_SYRINGE | INTRAVENOUS | Status: DC | PRN
  Administered 2022-02-07: 25 mg via INTRAVENOUS

## 2022-02-07 MED ORDER — PROPOFOL 1000 MG/100ML IV EMUL
INTRAVENOUS | Status: AC
  Filled 2022-02-07: qty 100

## 2022-02-07 MED ORDER — PROPOFOL 10 MG/ML IV BOLUS
INTRAVENOUS | Status: DC | PRN
Start: 2022-02-07 — End: 2022-02-07
  Administered 2022-02-07: 70 mg via INTRAVENOUS

## 2022-02-07 MED ORDER — SODIUM CHLORIDE 0.9 % IV SOLN: INTRAVENOUS | Status: DC

## 2022-02-07 NOTE — Anesthesia Postprocedure Evaluation (Signed)
Anesthesia Post Note  Patient: Darren Wang  Procedure(s) Performed: COLONOSCOPY  Patient location during evaluation: Endoscopy Anesthesia Type: General Level of consciousness: awake and alert Pain management: pain level controlled Vital Signs Assessment: post-procedure vital signs reviewed and stable Respiratory status: spontaneous breathing, nonlabored ventilation, respiratory function stable and patient connected to nasal cannula oxygen Cardiovascular status: blood pressure returned to baseline and stable Postop Assessment: no apparent nausea or vomiting Anesthetic complications: no   No notable events documented.   Last Vitals:  Vitals:   02/07/22 0857 02/07/22 0907  BP: 103/70 131/79  Pulse:    Resp:    Temp: (!) 36.1 C   SpO2:      Last Pain:  Vitals:   02/07/22 0907  TempSrc:   PainSc: 0-No pain                 Dimas Millin

## 2022-02-07 NOTE — Transfer of Care (Signed)
Immediate Anesthesia Transfer of Care Note  Patient: ELJAY LAVE  Procedure(s) Performed: COLONOSCOPY  Patient Location: PACU  Anesthesia Type:MAC  Level of Consciousness: drowsy  Airway & Oxygen Therapy: Patient Spontanous Breathing and Patient connected to nasal cannula oxygen  Post-op Assessment: Report given to RN and Post -op Vital signs reviewed and stable  Post vital signs: stable  Last Vitals:  Vitals Value Taken Time  BP 103/70 02/07/22 0857  Temp 36.1 C 02/07/22 0857  Pulse 51 02/07/22 0859  Resp 17 02/07/22 0859  SpO2 99 % 02/07/22 0859  Vitals shown include unvalidated device data.  Last Pain:  Vitals:   02/07/22 0857  TempSrc: Temporal  PainSc: Asleep         Complications: No notable events documented.

## 2022-02-07 NOTE — Op Note (Signed)
[facility] Gastroenterology Patient Name: Darren Wang Procedure Date: 02/07/2022 8:32 AM MRN: [id number] Account #: [id number] Date of Birth: 1951-01-20 Admit Type: Outpatient Age: 71 Room: [facility] ENDO ROOM 1 Gender: Male Note Status: Finalized Instrument Name: Park Meo [id number] Procedure:             Colonoscopy Indications:           High risk colon cancer surveillance: Personal history                         of sessile serrated colon polyp (less than 10 mm in                         size) with no dysplasia Providers:             Andrey Farmer MD, MD Referring MD:          Sofie Hartigan (Referring MD) Medicines:             Monitored Anesthesia Care Complications:         No immediate complications. Estimated blood loss:                         Minimal. Procedure:             Pre-Anesthesia Assessment:                        - Prior to the procedure, a History and Physical was                         performed, and patient medications and allergies were                         reviewed. The patient is competent. The risks and                         benefits of the procedure and the sedation options and                         risks were discussed with the patient. All questions                         were answered and informed consent was obtained.                         Patient identification and proposed procedure were                         verified by the physician, the nurse, the                         anesthesiologist, the anesthetist and the technician                         in the endoscopy suite. Mental Status Examination:                         alert and oriented. Airway Examination: normal  oropharyngeal airway and neck mobility. Respiratory                         Examination: clear to auscultation. CV Examination:                         normal. Prophylactic Antibiotics: The patient does not                          require prophylactic antibiotics. Prior                         Anticoagulants: The patient has taken Plavix                         (clopidogrel), last dose was 7 days prior to                         procedure. ASA Grade Assessment: III - A patient with                         severe systemic disease. After reviewing the risks and                         benefits, the patient was deemed in satisfactory                         condition to undergo the procedure. The anesthesia                         plan was to use monitored anesthesia care (MAC).                         Immediately prior to administration of medications,                         the patient was re-assessed for adequacy to receive                         sedatives. The heart rate, respiratory rate, oxygen                         saturations, blood pressure, adequacy of pulmonary                         ventilation, and response to care were monitored                         throughout the procedure. The physical status of the                         patient was re-assessed after the procedure.                        After obtaining informed consent, the colonoscope was                         passed under direct vision. Throughout the procedure,  the patient's blood pressure, pulse, and oxygen                         saturations were monitored continuously. The                         Colonoscope was introduced through the anus and                         advanced to the the cecum, identified by appendiceal                         orifice and ileocecal valve. The colonoscopy was                         performed without difficulty. The patient tolerated                         the procedure well. The quality of the bowel                         preparation was good. Findings:      The perianal and digital rectal examinations were normal.      A 2 mm polyp was found in the ileocecal valve. The  polyp was sessile.       The polyp was removed with a cold snare. Resection and retrieval were       complete. Estimated blood loss was minimal.      A 1 mm polyp was found in the ascending colon. The polyp was sessile.       The polyp was removed with a jumbo cold forceps. Resection and retrieval       were complete. Estimated blood loss was minimal.      Internal hemorrhoids were found during retroflexion. The hemorrhoids       were Grade I (internal hemorrhoids that do not prolapse).      The exam was otherwise without abnormality on direct and retroflexion       views. Impression:            - One 2 mm polyp at the ileocecal valve, removed with                         a cold snare. Resected and retrieved.                        - One 1 mm polyp in the ascending colon, removed with                         a jumbo cold forceps. Resected and retrieved.                        - Internal hemorrhoids.                        - The examination was otherwise normal on direct and                         retroflexion views. Recommendation:        - Discharge patient to home.                        -  Resume previous diet.                        - Continue present medications.                        - Await pathology results.                        - Repeat colonoscopy for surveillance based on                         pathology results.                        - Return to referring physician as previously                         scheduled. Procedure Code(s):     --- Professional ---                        [phone], Colonoscopy, flexible; with removal of                         tumor(s), polyp(s), or other lesion(s) by snare                         technique                        45380, 52, Colonoscopy, flexible; with biopsy, single                         or multiple Diagnosis Code(s):     --- Professional ---                        Z86.010, Personal history of colonic polyps                         K63.5, Polyp of colon                        K64.0, First degree hemorrhoids CPT copyright 2019 [employer]. All rights reserved. The codes documented in this report are preliminary and upon coder review may  be revised to meet current compliance requirements. Andrey Farmer MD, MD 02/07/2022 8:56:03 AM Number of Addenda: 0 Note Initiated On: 02/07/2022 8:32 AM Scope Withdrawal Time: 0 hours 8 minutes 13 seconds  Total Procedure Duration: 0 hours 14 minutes 5 seconds  Estimated Blood Loss:  Estimated blood loss was minimal.      [facility]

## 2022-02-07 NOTE — Anesthesia Preprocedure Evaluation (Addendum)
Anesthesia Evaluation  Patient identified by MRN, date of birth, ID band Patient awake    Reviewed: Allergy & Precautions, H&P , NPO status , Patient's Chart, lab work & pertinent test results, reviewed documented beta blocker date and time   History of Anesthesia Complications Negative for: history of anesthetic complications  Airway Mallampati: I  TM Distance: >3 FB Neck ROM: full    Dental  (+) Dental Advidsory Given, Teeth Intact   Pulmonary neg pulmonary ROS, former smoker,  H/o Pulmonary embolism- anticoagulation held   Pulmonary exam normal        Cardiovascular Exercise Tolerance: Good (-) hypertension(-) angina+ CAD  (-) Cardiac Stents and (-) CABG Normal cardiovascular exam(-) dysrhythmias (-) Valvular Problems/Murmurs     Neuro/Psych neg Seizures PSYCHIATRIC DISORDERS Depression Dementia TIA   GI/Hepatic Neg liver ROS, GERD  Controlled,  Endo/Other  negative endocrine ROS  Renal/GU negative Renal ROS  negative genitourinary   Musculoskeletal   Abdominal   Peds  Hematology negative hematology ROS (+)   Anesthesia Other Findings Past Medical History: No date: Dementia (HCC) No date: Dementia (HCC) No date: GERD (gastroesophageal reflux disease) No date: History of kidney stones No date: Myocardial infarction ([place]) No date: Parkinson disease ([place]) No date: TIA (transient ischemic attack)   Reproductive/Obstetrics negative OB ROS                             Anesthesia Physical  Anesthesia Plan  ASA: 2  Anesthesia Plan: General   Post-op Pain Management: Minimal or no pain anticipated   Induction: Intravenous  PONV Risk Score and Plan: 2 and Propofol infusion and TIVA  Airway Management Planned: Natural Airway and Nasal Cannula  Additional Equipment: None  Intra-op Plan:   Post-operative Plan:   Informed Consent: I have reviewed the patients History and  Physical, chart, labs and discussed the procedure including the risks, benefits and alternatives for the proposed anesthesia with the patient or authorized representative who has indicated his/her understanding and acceptance.     Dental Advisory Given  Plan Discussed with: Anesthesiologist, CRNA and Surgeon  Anesthesia Plan Comments: (Discussed risks of anesthesia with patient, including possibility of difficulty with spontaneous ventilation under anesthesia necessitating airway intervention, PONV, and rare risks such as cardiac or respiratory or neurological events, and allergic reactions. Discussed the role of CRNA in patient's perioperative care. Patient understands.)        Anesthesia Quick Evaluation

## 2022-02-07 NOTE — Interval H&P Note (Signed)
History and Physical Interval Note:  02/07/2022 8:32 AM  Darren Wang  has presented today for surgery, with the diagnosis of H/O TA Polyps.  The various methods of treatment have been discussed with the patient and family. After consideration of risks, benefits and other options for treatment, the patient has consented to  Procedure(s): COLONOSCOPY (N/A) as a surgical intervention.  The patient's history has been reviewed, patient examined, no change in status, stable for surgery.  I have reviewed the patient's chart and labs.  Questions were answered to the patient's satisfaction.     Lesly Rubenstein  Ok to proceed with colonoscopy

## 2022-02-07 NOTE — H&P (Signed)
Outpatient short stay form Pre-procedure 02/07/2022  Lesly Rubenstein, MD  Primary Physician: Sofie Hartigan, MD  Reason for visit:  Surveillance colonoscopy  History of present illness:    71 y/o gentleman with history of HLD and GERD here for surveillance colonoscopy. Last colonoscopy in April of this year with poor prep but one SSA was found. History of cholecystectomy. Takes plavix with last dose being 7 days ago. No family history of GI malignancies.    Current Facility-Administered Medications:    0.9 %  sodium chloride infusion, , Intravenous, Continuous, Burel Kahre, Hilton Cork, MD, Last Rate: 20 mL/hr at 02/07/22 0827, New Bag at 02/07/22 0827  Medications Prior to Admission  Medication Sig Dispense Refill Last Dose   acetaminophen (TYLENOL) 500 MG tablet Take by mouth.   Past Month   albuterol (VENTOLIN HFA) 108 (90 Base) MCG/ACT inhaler INHALE 2 INHALATIONS INTO THE LUNGS EVERY 4 HOURS AS NEEDED FOR WHEEZING OR SHORTNESS OF BREATH      ascorbic acid (VITAMIN C) 500 MG tablet Take 1 tablet (500 mg total) by mouth daily. 10 tablet 0    atorvastatin (LIPITOR) 10 MG tablet Take 10 mg by mouth daily.      busPIRone (BUSPAR) 30 MG tablet Take 15 mg by mouth in the morning and at bedtime.       Carbidopa-Levodopa ER (SINEMET CR) 25-100 MG tablet controlled release Take 1 tablet by mouth 2 (two) times daily.      clonazePAM (KLONOPIN) 1 MG tablet Take 1 mg by mouth at bedtime as needed.       donepezil (ARICEPT) 10 MG tablet Take 10 mg by mouth daily before breakfast.      fexofenadine (ALLEGRA) 180 MG tablet Take by mouth.      memantine (NAMENDA) 10 MG tablet Take 10 mg by mouth daily.       Multiple Vitamin (MULTIVITAMIN) capsule Take 1 capsule by mouth daily.      Omega 3 1000 MG CAPS Take 1 capsule by mouth daily. (Patient not taking: Reported on 10/28/2021)      pantoprazole (PROTONIX) 40 MG tablet Take 40 mg by mouth 2 (two) times daily.       QUEtiapine (SEROQUEL) 25 MG  tablet Take 50 mg by mouth at bedtime. At 1700       RIVAROXABAN (XARELTO) VTE STARTER PACK (15 & 20 MG TABLETS) Follow package directions: Take one '15mg'$  tablet by mouth twice a day. On day 21, switch to one '20mg'$  tablet once a day. Take with food. 49 each 0 01/31/2022   sertraline (ZOLOFT) 100 MG tablet Take 200 mg by mouth daily.       vitamin B-12 (CYANOCOBALAMIN) 1000 MCG tablet Take 1,000 mcg by mouth daily. (Patient not taking: Reported on 10/28/2021)      zinc sulfate 220 (50 Zn) MG capsule Take 1 capsule (220 mg total) by mouth daily. (Patient not taking: Reported on 10/28/2021) 10 capsule 0      Allergies  Allergen Reactions   Benadryl [Diphenhydramine]    Neosporin [Bacitracin-Polymyxin B]      Past Medical History:  Diagnosis Date   Anginal pain (HCC)    Dementia (HCC)    Dementia (HCC)    GERD (gastroesophageal reflux disease)    History of kidney stones    Myocardial infarction ([place])    Parkinson disease ([place])    TIA (transient ischemic attack)     Review of systems:  Otherwise negative.    Physical Exam  Gen: Alert, oriented. Appears stated age.  HEENT: PERRLA. Lungs: No respiratory distress CV: RRR Abd: soft, benign, no masses Ext: No edema    Planned procedures: Proceed with colonoscopy. The patient understands the nature of the planned procedure, indications, risks, alternatives and potential complications including but not limited to bleeding, infection, perforation, damage to internal organs and possible oversedation/side effects from anesthesia. The patient agrees and gives consent to proceed.  Please refer to procedure notes for findings, recommendations and patient disposition/instructions.     Lesly Rubenstein, MD [facility] Gastroenterology

## 2022-02-08 ENCOUNTER — Encounter: Payer: Self-pay | Admitting: Gastroenterology

## 2022-02-08 LAB — SURGICAL PATHOLOGY

## 2024-05-22 ENCOUNTER — Other Ambulatory Visit: Payer: Self-pay | Admitting: Physician Assistant

## 2024-05-22 DIAGNOSIS — R29818 Other symptoms and signs involving the nervous system: Secondary | ICD-10-CM

## 2024-05-22 DIAGNOSIS — R413 Other amnesia: Secondary | ICD-10-CM

## 2024-05-23 ENCOUNTER — Ambulatory Visit
Admission: RE | Admit: 2024-05-23 | Discharge: 2024-05-23 | Disposition: A | Discharge status: Home / Self Care | Source: Ambulatory Visit | Attending: Physician Assistant | Admitting: Physician Assistant

## 2024-05-23 DIAGNOSIS — R413 Other amnesia: Secondary | ICD-10-CM | POA: Insufficient documentation

## 2024-05-23 DIAGNOSIS — R29818 Other symptoms and signs involving the nervous system: Secondary | ICD-10-CM | POA: Insufficient documentation
